# Patient Record
Sex: Female | Born: 1995 | Hispanic: Yes | Marital: Single | Smoking: Never smoker
Health system: Southern US, Community
[De-identification: ages and names within clinical notes are randomized; demographics above are authoritative.]

## PROBLEM LIST (undated history)

## (undated) ENCOUNTER — Inpatient Hospital Stay: Payer: Self-pay

## (undated) DIAGNOSIS — G43909 Migraine, unspecified, not intractable, without status migrainosus: Secondary | ICD-10-CM

## (undated) DIAGNOSIS — O361920 Maternal care for other isoimmunization, second trimester, not applicable or unspecified: Secondary | ICD-10-CM

## (undated) DIAGNOSIS — D649 Anemia, unspecified: Secondary | ICD-10-CM

## (undated) DIAGNOSIS — Z789 Other specified health status: Secondary | ICD-10-CM

## (undated) DIAGNOSIS — O09299 Supervision of pregnancy with other poor reproductive or obstetric history, unspecified trimester: Secondary | ICD-10-CM

## (undated) HISTORY — DX: Migraine, unspecified, not intractable, without status migrainosus: G43.909

## (undated) HISTORY — PX: APPENDECTOMY: SHX54

---
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
## 2013-07-28 DIAGNOSIS — O1490 Unspecified pre-eclampsia, unspecified trimester: Secondary | ICD-10-CM

## 2016-11-22 NOTE — L&D Delivery Note (Addendum)
Date of delivery: 05/21/17 Estimated Date of Delivery: 05/11/17 LMP:  08/04/16 EGA: 1044w4d  Delivery Note At 11:47 PM a viable female "Dylan" was delivered via Vaginal, Spontaneous Delivery (Presentation: ROA).  APGAR: 8, 9; weight  pending.   Placenta status: spontaneous, intact.  Cord: 3vv, with the following complications: meconium.  Cord pH: not collected  Anesthesia:  Epidural Episiotomy: None Lacerations: 1st degree Suture Repair: 3.0 vicryl Est. Blood Loss (mL): 350cc   Mom presented to L&D with labor. She was given antibiotics for GBS prophylaxis and then AROM'd for meconium.  epidual placed. Progressed to complete, labored down for 2 hours. active second stage: ~1hour.  delivery of fetal head with restitution to ROT.   Anterior then posterior shoulders delivered without difficulty.  Baby placed on mom's chest, and attended to by peds.  We sang happy birthday to baby Dylan.  Cord was then clamped and cut after 60second delay.  Placenta spontaneously delivered, intact.   IV pitocin given for hemorrhage prophylaxis. 1st degree laceration repaired in standard fashion.  Mom to postpartum.  Baby to Couplet care / Skin to Skin.  ----- Ranae Plumberhelsea Nyeem Stoke, MD Attending Obstetrician and Gynecologist [facility], Department of OB/GYN [facility]

## 2016-12-17 ENCOUNTER — Emergency Department
Admission: EM | Admit: 2016-12-17 | Discharge: 2016-12-18 | Disposition: A | Discharge status: Home / Self Care | Payer: Self-pay | Attending: Emergency Medicine | Admitting: Emergency Medicine

## 2016-12-17 ENCOUNTER — Encounter: Payer: Self-pay | Admitting: Emergency Medicine

## 2016-12-17 DIAGNOSIS — R519 Headache, unspecified: Secondary | ICD-10-CM

## 2016-12-17 DIAGNOSIS — Z3A18 18 weeks gestation of pregnancy: Secondary | ICD-10-CM | POA: Insufficient documentation

## 2016-12-17 DIAGNOSIS — R1110 Vomiting, unspecified: Secondary | ICD-10-CM

## 2016-12-17 DIAGNOSIS — O26892 Other specified pregnancy related conditions, second trimester: Secondary | ICD-10-CM | POA: Insufficient documentation

## 2016-12-17 DIAGNOSIS — R51 Headache: Secondary | ICD-10-CM

## 2016-12-17 DIAGNOSIS — Z3492 Encounter for supervision of normal pregnancy, unspecified, second trimester: Secondary | ICD-10-CM

## 2016-12-17 DIAGNOSIS — O219 Vomiting of pregnancy, unspecified: Secondary | ICD-10-CM | POA: Insufficient documentation

## 2016-12-17 LAB — BASIC METABOLIC PANEL
Anion gap: 8 (ref 5–15)
BUN: 8 mg/dL (ref 6–20)
CALCIUM: 8.8 mg/dL — AB (ref 8.9–10.3)
CO2: 25 mmol/L (ref 22–32)
CREATININE: 0.41 mg/dL — AB (ref 0.44–1.00)
Chloride: 103 mmol/L (ref 101–111)
GFR calc Af Amer: >60 mL/min (ref >60)
Glucose, Bld: 100 mg/dL — ABNORMAL HIGH (ref 65–99)
Potassium: 3.6 mmol/L (ref 3.5–5.1)
SODIUM: 136 mmol/L (ref 135–145)

## 2016-12-17 LAB — CBC WITH DIFFERENTIAL/PLATELET
Basophils Absolute: 0 10*3/uL (ref 0–0.1)
Basophils Relative: 0 %
EOS ABS: 0.2 10*3/uL (ref 0–0.7)
EOS PCT: 1 %
HCT: 32.3 % — ABNORMAL LOW (ref 35.0–47.0)
Hemoglobin: 11.1 g/dL — ABNORMAL LOW (ref 12.0–16.0)
LYMPHS ABS: 2.2 10*3/uL (ref 1.0–3.6)
Lymphocytes Relative: 17 %
MCH: 28.1 pg (ref 26.0–34.0)
MCHC: 34.4 g/dL (ref 32.0–36.0)
MCV: 81.6 fL (ref 80.0–100.0)
MONOS PCT: 7 %
Monocytes Absolute: 0.9 10*3/uL (ref 0.2–0.9)
Neutro Abs: 9.8 10*3/uL — ABNORMAL HIGH (ref 1.4–6.5)
Neutrophils Relative %: 75 %
PLATELETS: 364 10*3/uL (ref 150–440)
RBC: 3.96 MIL/uL (ref 3.80–5.20)
RDW: 17.3 % — ABNORMAL HIGH (ref 11.5–14.5)
WBC: 13.2 10*3/uL — AB (ref 3.6–11.0)

## 2016-12-17 LAB — URINALYSIS, ROUTINE W REFLEX MICROSCOPIC
Bilirubin Urine: NEGATIVE
GLUCOSE, UA: NEGATIVE mg/dL
HGB URINE DIPSTICK: NEGATIVE
KETONES UR: NEGATIVE mg/dL
Nitrite: NEGATIVE
Protein, ur: NEGATIVE mg/dL
Specific Gravity, Urine: 1.014 (ref 1.005–1.030)
pH: 7 (ref 5.0–8.0)

## 2016-12-17 MED ORDER — ONDANSETRON 4 MG PO TBDP
4.0000 mg | ORAL_TABLET | Freq: Once | ORAL | Status: AC
  Administered 2016-12-17: 4 mg via ORAL
  Filled 2016-12-17: qty 1

## 2016-12-17 NOTE — ED Triage Notes (Signed)
Pt states that when she woke up yesterday she had a headache. Pt states that now it is worse. Pt states that she has vomited x5 since yesterday. Pt is ambulatory to triage with NAD noted at this time.

## 2016-12-17 NOTE — ED Triage Notes (Signed)
Pt is pregnant at this time but has had no prenatal care. Pt noticed in December that she was missing periods.

## 2016-12-17 NOTE — ED Triage Notes (Signed)
Pt is 2-3 months pregnant, flew in from [place] x 10 days ago. Pt c/o headache and vomiting x 2 days. Pt has taken no meds for her pain. Pt ambulatory to stat desk, in no acute distress at this time. Pt has had no prenatal care for this pregnancy. Pt is G3P2A0.

## 2016-12-18 ENCOUNTER — Emergency Department: Payer: Self-pay

## 2016-12-18 LAB — PREGNANCY, URINE: PREG TEST UR: POSITIVE — AB

## 2016-12-18 LAB — HCG, QUANTITATIVE, PREGNANCY: hCG, Beta Chain, Quant, S: 35714 m[IU]/mL — ABNORMAL HIGH (ref <5)

## 2016-12-18 MED ORDER — DOXYLAMINE-PYRIDOXINE 10-10 MG PO TBEC: DELAYED_RELEASE_TABLET | ORAL | 0 refills | Status: DC

## 2016-12-18 MED ORDER — PROCHLORPERAZINE EDISYLATE 5 MG/ML IJ SOLN
10.0000 mg | Freq: Once | INTRAMUSCULAR | Status: AC
  Administered 2016-12-18: 10 mg via INTRAVENOUS
  Filled 2016-12-18: qty 2

## 2016-12-18 MED ORDER — SODIUM CHLORIDE 0.9 % IV BOLUS (SEPSIS)
1000.0000 mL | Freq: Once | INTRAVENOUS | Status: AC
  Administered 2016-12-18: 1000 mL via INTRAVENOUS

## 2016-12-18 MED ORDER — ACETAMINOPHEN 500 MG PO TABS
1000.0000 mg | ORAL_TABLET | Freq: Once | ORAL | Status: AC
  Administered 2016-12-18: 1000 mg via ORAL
  Filled 2016-12-18: qty 2

## 2016-12-18 NOTE — ED Provider Notes (Addendum)
[facility] Emergency Department Provider Note  ____________________________________________   I have reviewed the triage vital signs and the nursing notes.   HISTORY  Chief Complaint Headache    HPI Sabrina Pace is a 21 y.o. female who is G3 A2, with 2 living children, states her last stroke. Was last June. However at that time she states she had negative blood test and was told she is not pregnant. However, she has had progressive abdominal distention since that time and has been feeling sick off and on. Patient states that she is here from [place]. She has never been tonight states before. She arrived on Wednesday by plane. She states that this is her new home. She is interested in obtaining information about the status of her pregnancy, she also states that she has had a vomiting illness the last few days. She is still feeling the baby move, she's had no vaginal bleeding. She has had a slight headache for the last few days as well. Not the worst headache of life, gradual onset started yesterday. Accompanied with vomiting. Denies any focal neurologic deficit. Denies any stiff neck. Denies any change in vision or hearing, no history of IV drug use, does have a history of recurrent headaches similar to this in the past, not worst headache of life. Has had no diarrhea yet. Positive sick contacts with similar. She is not sure which one started first. Patient was offered an interpreter, she would prefer to have me here and feels very comfortable with my Spanish. She is made aware that at any point she can call for interpreter if  she feels uncomfortable. Patient did receive Zofran in the waiting room, and she is no longer nauseated. She feels her headache is improving. He is taken nothing for the headache today, yesterday she took some Advil. Patient has had appendectomy in the past no other medical conditions. She denies allergies. She denies any focal  neurologic deficit, she is having no abdominal pain she had no vaginal discharge she denies dysuria.   History reviewed. No pertinent past medical history.  There are no active problems to display for this patient.   Past Surgical History:  Procedure Laterality Date  . APPENDECTOMY      Prior to Admission medications   Not on File    Allergies Patient has no known allergies.  No family history on file.  Social History Social History  Substance Use Topics  . Smoking status: Never Smoker  . Smokeless tobacco: Never Used  . Alcohol use No    Review of Systems Constitutional: No fever/chills Eyes: No visual changes. ENT: No sore throat. No stiff neck no neck pain Cardiovascular: Denies chest pain. Respiratory: Denies shortness of breath. Gastrointestinal:   Positive vomiting.  No diarrhea.  No constipation. Genitourinary: Negative for dysuria. Musculoskeletal: Negative lower extremity swelling Skin: Negative for rash. Neurological: Negative for severe headaches, focal weakness or numbness. 10-point ROS otherwise negative.  ____________________________________________   PHYSICAL EXAM:  VITAL SIGNS: ED Triage Vitals  Enc Vitals Group     BP 12/17/16 2305 111/62     Pulse Rate 12/17/16 2305 77     Resp 12/17/16 2305 16     Temp 12/17/16 2305 97.5 F (36.4 C)     Temp Source 12/17/16 2305 Oral     SpO2 12/17/16 2305 99 %     Weight 12/17/16 2306 120 lb (54.4 kg)     Height 12/17/16 2306 4\' 11"  (1.499 m)  Head Circumference --      Peak Flow --      Pain Score 12/17/16 2309 9     Pain Loc --      Pain Edu? --      Excl. in GC? --     Constitutional: Alert and oriented. Well appearing and in no acute distress.Very well-appearing in no acute distress, patient not showing any evidence of illness externally. Eyes: Conjunctivae are normal. PERRL. EOMI. Head: Atraumatic. Nose: No congestion/rhinnorhea. Mouth/Throat: Mucous membranes are moist.  Oropharynx  non-erythematous. Neck: No stridor.   Nontender with no meningismus patient can fully range her neck in all directions including touching her chin to her chest with no discomfort Cardiovascular: Normal rate, regular rhythm. Grossly normal heart sounds.  Good peripheral circulation. Respiratory: Normal respiratory effort.  No retractions. Lungs CTAB. Abdominal: Soft and nontender. To my exam, gravid uterus felt around the umbilicus, nontender. No guarding no rebound Back:  There is no focal tenderness or step off.  there is no midline tenderness there are no lesions noted. there is no CVA tenderness  Musculoskeletal: No lower extremity tenderness, no upper extremity tenderness. No joint effusions, no DVT signs strong distal pulses no edema Neurologic:  Normal speech and language. No gross focal neurologic deficits are appreciated.  Skin:  Skin is warm, dry and intact. No rash noted. Psychiatric: Mood and affect are normal. Speech and behavior are normal.  ____________________________________________   LABS (all labs ordered are listed, but only abnormal results are displayed)  Labs Reviewed  URINALYSIS, ROUTINE W REFLEX MICROSCOPIC - Abnormal; Notable for the following:       Result Value   Color, Urine YELLOW (*)    APPearance CLEAR (*)    Leukocytes, UA SMALL (*)    Bacteria, UA FEW (*)    Squamous Epithelial / LPF 6-30 (*)    All other components within normal limits  CBC WITH DIFFERENTIAL/PLATELET - Abnormal; Notable for the following:    WBC 13.2 (*)    Hemoglobin 11.1 (*)    HCT 32.3 (*)    RDW 17.3 (*)    Neutro Abs 9.8 (*)    All other components within normal limits  BASIC METABOLIC PANEL - Abnormal; Notable for the following:    Glucose, Bld 100 (*)    Creatinine, Ser 0.41 (*)    Calcium 8.8 (*)    All other components within normal limits  HCG, QUANTITATIVE, PREGNANCY - Abnormal; Notable for the following:    hCG, Beta Chain, Quant, S 35,714 (*)    All other  components within normal limits  PREGNANCY, URINE   ____________________________________________  EKG  I personally interpreted any EKGs ordered by me or triage  ____________________________________________  RADIOLOGY  I reviewed any imaging ordered by me or triage that were performed during my shift and, if possible, patient and/or family made aware of any abnormal findings. ____________________________________________   PROCEDURES  Procedure(s) performed: None  Procedures  Critical Care performed: None  ____________________________________________   INITIAL IMPRESSION / ASSESSMENT AND PLAN / ED COURSE  Pertinent labs & imaging results that were available during my care of the patient were reviewed by me and considered in my medical decision making (see chart for details).  Patient here with 2 different issues the first is she has no idea how far along her pregnancy isn't to my exam she is likely well over 20 weeks. We will obtain an ultrasound. Very low suspicion for preeclampsia given normal pressures. White count  somewhat elevated but does not unusual in pregnancy. No evidence of acute pathology with pregnancy today but she will require further evaluation. Specifically she is not had any vaginal bleeding or gush of fluid labor pains abdominal pain etc. Second issue is her vomiting and headache. Patient has a slight headache. She certainly does not appear to be toxic. Low suspicion for meningitis however low suspicion for cavernous sinus thrombosis or other acute intracranial pathology. Obviously, given her pregnancy, we prefer to avoid CT scan if it can be helped. We will treat her with Tylenol and IV fluids as she does appear to be mildly dehydrated after her airplane trip and we will reassess.  ----------------------------------------- 3:01 AM on 12/18/2016 -----------------------------------------  Patient took a nice nap care, workup has no headache, we discussed LP and  CT of the head she and her family declined. There are some limitations of my workup and even though I have very low suspicion for significant cranial pathology, I recently there is no way to definitively rule anything out without further workup which the family would prefer not to have. I do not think this is unreasonable however I did want to make sure that they understand the limitations of the workup for her headache as it stands, and the available interventions and the risks benefits and alternatives to each of those. Patient and family voiced very clear understanding of all of this, and they would prefer to go home at this time. They certainly do not wish an LP or ACT. Patient and family will follow-up very closely at the health department for her pregnancy, I will advise that she take prenatal vitamins, there is no evidence of acute pathology with the pregnancy today and she is approximately [redacted] weeks pregnant with no vaginal bleeding or abdominal pain. Patient is in her third pregnancy, she understands what to look for. We will give her antinausea medication, and extensive return precautions and her native language.    ____________________________________________   FINAL CLINICAL IMPRESSION(S) / ED DIAGNOSES  Final diagnoses:  None      This chart was dictated using voice recognition software.  Despite best efforts to proofread,  errors can occur which can change meaning.      Jeanmarie Plant, MD 12/18/16 1610    Jeanmarie Plant, MD 12/18/16 9604    Jeanmarie Plant, MD 12/18/16 [phone]

## 2016-12-18 NOTE — ED Notes (Signed)
Lab called

## 2017-01-18 ENCOUNTER — Other Ambulatory Visit: Payer: Self-pay | Admitting: Advanced Practice Midwife

## 2017-01-18 DIAGNOSIS — Z3482 Encounter for supervision of other normal pregnancy, second trimester: Secondary | ICD-10-CM

## 2017-01-18 LAB — HM PAP SMEAR: HM Pap smear: NEGATIVE

## 2017-01-25 ENCOUNTER — Other Ambulatory Visit: Payer: Self-pay | Admitting: Advanced Practice Midwife

## 2017-01-25 DIAGNOSIS — Z3689 Encounter for other specified antenatal screening: Secondary | ICD-10-CM

## 2017-01-27 ENCOUNTER — Ambulatory Visit: Payer: Self-pay

## 2017-02-07 ENCOUNTER — Ambulatory Visit
Admission: RE | Admit: 2017-02-07 | Discharge: 2017-02-07 | Disposition: A | Discharge status: Home / Self Care | Payer: Medicaid Other | Source: Ambulatory Visit | Attending: Obstetrics and Gynecology | Admitting: Obstetrics and Gynecology

## 2017-02-07 ENCOUNTER — Ambulatory Visit
Admission: RE | Admit: 2017-02-07 | Discharge: 2017-02-07 | Disposition: A | Discharge status: Home / Self Care | Payer: Self-pay | Source: Ambulatory Visit | Attending: Obstetrics and Gynecology | Admitting: Obstetrics and Gynecology

## 2017-02-07 ENCOUNTER — Other Ambulatory Visit: Payer: Self-pay | Admitting: Obstetrics and Gynecology

## 2017-02-07 DIAGNOSIS — O361920 Maternal care for other isoimmunization, second trimester, not applicable or unspecified: Secondary | ICD-10-CM

## 2017-02-07 DIAGNOSIS — O0932 Supervision of pregnancy with insufficient antenatal care, second trimester: Secondary | ICD-10-CM | POA: Insufficient documentation

## 2017-02-07 DIAGNOSIS — Z3689 Encounter for other specified antenatal screening: Secondary | ICD-10-CM

## 2017-02-07 DIAGNOSIS — IMO0002 Reserved for concepts with insufficient information to code with codable children: Secondary | ICD-10-CM | POA: Insufficient documentation

## 2017-02-07 DIAGNOSIS — Z3A26 26 weeks gestation of pregnancy: Secondary | ICD-10-CM | POA: Insufficient documentation

## 2017-02-07 DIAGNOSIS — Z3482 Encounter for supervision of other normal pregnancy, second trimester: Secondary | ICD-10-CM

## 2017-02-07 DIAGNOSIS — Z915 Personal history of self-harm: Secondary | ICD-10-CM

## 2017-02-07 DIAGNOSIS — Z8759 Personal history of other complications of pregnancy, childbirth and the puerperium: Secondary | ICD-10-CM | POA: Diagnosis not present

## 2017-02-07 DIAGNOSIS — Z9289 Personal history of other medical treatment: Secondary | ICD-10-CM | POA: Insufficient documentation

## 2017-02-07 DIAGNOSIS — O09292 Supervision of pregnancy with other poor reproductive or obstetric history, second trimester: Secondary | ICD-10-CM

## 2017-02-07 HISTORY — DX: Anemia, unspecified: D64.9

## 2017-02-07 HISTORY — DX: Personal history of other medical treatment: Z92.89

## 2017-02-07 HISTORY — DX: Maternal care for other isoimmunization, second trimester, not applicable or unspecified: O36.1920

## 2017-02-07 HISTORY — DX: Other specified health status: Z78.9

## 2017-02-07 NOTE — Progress Notes (Signed)
[facility]aternal-[facility]etal [facility] Consultation   Chief Complaint: Anti-Kell antibody  HPI: Ms. Sabrina Pace is a 21 y.o. 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  Past Medical History: Patient  has a past medical history of Anemia requiring blood transfusion after her 2nd delivery (she denies complications of postpartum hemorrhage).  She does have a history of self-injurious behavior and admits to cutting.  Had declined a therapist previously but will consider it. Past Surgical History: She  has a past surgical history that includes Appendectomy.  Obstetric History:  OB History    Gravida Para Term Preterm AB Living   3 2 2     2    SAB TAB Ectopic Multiple Live Births                Each pregnancy is with a different FOB. The current pregnancy's FOB is currently not in the [place].  Gynecologic History:  No LMP recorded. Patient is pregnant.  Last pap smear patient has never had a pap test  Was diagnosed with chlamydia at the HD and was treated.  Medications: PNVs, iron Allergies: Patient has No Known Allergies.  Social History: Patient  reports that she has never smoked. She has never used smokeless tobacco. She reports that she does not drink alcohol or use drugs. Reported last ETOH use was May 2017.  Her 2 other children are in [place] with her MGM.  The father of this baby is currently trying to make his way to the [place].  Patient feels very conflicted about being here. Family History: family history includes Diabetes in her maternal grandmother and mother; Hypertension in her maternal grandmother. Her mother had premature twins who had epilepsy as children Review of Systems A full 12 point review of systems was negative or as noted in the History of Present Illness.  US today demonstrates normal anatomy; EGA of 6162w5d.  948 gramps, 35th%, posterior placenta.   MCA Dopplers were in zone C (NOT consistent with anemia).  Physical Exam: BP (!) 96/54   Pulse 78   Temp 97.5 F (36.4 C)   Resp 16   Ht 5\' 3"  (1.6 m)   Wt 153 lb (69.4 kg)   SpO2 98%   BMI 27.10 kg/m    Asessement: Patient Active Problem List   Diagnosis Date Noted  . 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 History of blood transfusion 02/07/2017  . History of self-harm 02/07/2017    Plan: 1.  Alloimmunization.  Anti-Kell antibody can cause hemolytic disease of the fetus and/or newborn.  There is no "safe" titer unlike other antibodies.  However, it is very likely that she became sensitized after her blood transfusion in [place].  Currently the FOB is not living in the [place] so is unavailable for antigen testing.  The current FOB is different from other other children.  Offered amniocentesis to determine fetal antigen status versus expectant management with MCA Dopplers/hydrops evaluation every 2 weeks to screen for fetal anemia.  Patient elected to schedule an amniocentesis for fetal antigen testing -  [facility] 7:30 am.  Instructions were provided. 2.  Patient denies a history of preeclampsia or any problems with hypertension (this was noted in her prenatal records from the HD).  She does admit to having HAs and occasional dizziness but not elevated BPs. - her 2nd child, however was only 5 #.  Consider [place] in the third trimester to assess growth. 3.  History of blood transfusion - recommend type and crossing for 2U at the time of delivery given presence of an antibody and history of anemia - patient states she had no bleeding complications, this was prophylactically given to her because of her anemia. 4.  History of self-harm - admits to cutting behavior.  Offered referral to therapist and acknowledged that her social history is very stressful.  She hasn't seen her mother in 20 years and she brought her here with the  opportunity to acquire legal status and hopefully to be able to bring her other children here.  But living with her mother and other younger siblings has been difficult and she misses her home.  Will consider referral (please reoffer at the HD).  Mother seems very supportive.  Will follow up after results of amniocentesis.  If fetus is Kell antigen negative, will not need to follow this pregnancy and she may return to routine prenatal care.   Total time spent with the patient was 30 minutes with greater than 50% spent in counseling and coordination of care.  We appreciate this interesting consult and will be happy to be involved in the ongoing care of Sabrina Pace in anyway her obstetricians desire.  Kirby Funk, MD Maternal-Fetal Medicine [facility]

## 2017-02-24 ENCOUNTER — Other Ambulatory Visit: Payer: Self-pay

## 2017-03-03 ENCOUNTER — Ambulatory Visit
Admission: RE | Admit: 2017-03-03 | Discharge: 2017-03-03 | Disposition: A | Discharge status: Home / Self Care | Payer: Self-pay | Source: Ambulatory Visit | Attending: Obstetrics & Gynecology | Admitting: Obstetrics & Gynecology

## 2017-03-03 DIAGNOSIS — Z915 Personal history of self-harm: Secondary | ICD-10-CM

## 2017-03-03 DIAGNOSIS — IMO0002 Reserved for concepts with insufficient information to code with codable children: Secondary | ICD-10-CM

## 2017-03-03 DIAGNOSIS — O361920 Maternal care for other isoimmunization, second trimester, not applicable or unspecified: Secondary | ICD-10-CM | POA: Diagnosis present

## 2017-03-03 DIAGNOSIS — Z9289 Personal history of other medical treatment: Secondary | ICD-10-CM

## 2017-03-03 NOTE — Progress Notes (Signed)
[facility] : Ms Sabrina Pace was seen with a Spanish interpreter. She is a 21 year-old G3 P2002 at 68 1/7 weeks who presents to review her amniocentesis results. She underwent an amniocentesis on 02/15/17 to test fetal Kell antigen status in setting of Kell isoimmunization. She has no complaints.  Fetal Kell testing returned from the [facility]. The fetus was found to have a normal karyotype (46, XY) and was not found to carry the Kell antigen (KK negative, kk positive), therefore her fetus is not at risk for anemia secondary to isoimmunization.  She can continue routine prenatal care and can deliver at [facility]. Return for growth scan in 2-3 weeks given history of prior delivery complicated by [facility]. This appt was provided.   Charmeka [place], [place] A, MD

## 2017-03-10 ENCOUNTER — Other Ambulatory Visit: Payer: Self-pay

## 2017-03-24 ENCOUNTER — Other Ambulatory Visit: Payer: Self-pay | Admitting: *Deleted

## 2017-03-24 DIAGNOSIS — Z8759 Personal history of other complications of pregnancy, childbirth and the puerperium: Secondary | ICD-10-CM

## 2017-03-25 DIAGNOSIS — R7611 Nonspecific reaction to tuberculin skin test without active tuberculosis: Secondary | ICD-10-CM | POA: Insufficient documentation

## 2017-03-28 ENCOUNTER — Ambulatory Visit
Admission: RE | Admit: 2017-03-28 | Discharge: 2017-03-28 | Disposition: A | Discharge status: Home / Self Care | Payer: Self-pay | Source: Ambulatory Visit | Attending: Family Medicine | Admitting: Family Medicine

## 2017-03-28 ENCOUNTER — Ambulatory Visit
Admission: RE | Admit: 2017-03-28 | Discharge: 2017-03-28 | Disposition: A | Discharge status: Home / Self Care | Payer: Self-pay | Source: Ambulatory Visit | Attending: Maternal & Fetal Medicine | Admitting: Maternal & Fetal Medicine

## 2017-03-28 ENCOUNTER — Other Ambulatory Visit (HOSPITAL_COMMUNITY): Payer: Self-pay | Admitting: Family Medicine

## 2017-03-28 DIAGNOSIS — O361920 Maternal care for other isoimmunization, second trimester, not applicable or unspecified: Secondary | ICD-10-CM

## 2017-03-28 DIAGNOSIS — Z915 Personal history of self-harm: Secondary | ICD-10-CM

## 2017-03-28 DIAGNOSIS — R7611 Nonspecific reaction to tuberculin skin test without active tuberculosis: Secondary | ICD-10-CM

## 2017-03-28 DIAGNOSIS — Z3A33 33 weeks gestation of pregnancy: Secondary | ICD-10-CM | POA: Insufficient documentation

## 2017-03-28 DIAGNOSIS — Z9289 Personal history of other medical treatment: Secondary | ICD-10-CM

## 2017-03-28 DIAGNOSIS — IMO0002 Reserved for concepts with insufficient information to code with codable children: Secondary | ICD-10-CM

## 2017-03-28 DIAGNOSIS — O26893 Other specified pregnancy related conditions, third trimester: Secondary | ICD-10-CM | POA: Insufficient documentation

## 2017-03-28 DIAGNOSIS — Z8759 Personal history of other complications of pregnancy, childbirth and the puerperium: Secondary | ICD-10-CM | POA: Insufficient documentation

## 2017-05-21 ENCOUNTER — Inpatient Hospital Stay
Admission: EM | Admit: 2017-05-21 | Discharge: 2017-05-23 | DRG: 775 | Disposition: A | Discharge status: Home / Self Care | Payer: Medicaid Other | Attending: Obstetrics & Gynecology | Admitting: Obstetrics & Gynecology

## 2017-05-21 ENCOUNTER — Inpatient Hospital Stay: Payer: Medicaid Other | Admitting: Anesthesiology

## 2017-05-21 ENCOUNTER — Encounter: Payer: Self-pay | Admitting: *Deleted

## 2017-05-21 DIAGNOSIS — O98819 Other maternal infectious and parasitic diseases complicating pregnancy, unspecified trimester: Secondary | ICD-10-CM

## 2017-05-21 DIAGNOSIS — O99340 Other mental disorders complicating pregnancy, unspecified trimester: Secondary | ICD-10-CM

## 2017-05-21 DIAGNOSIS — D509 Iron deficiency anemia, unspecified: Secondary | ICD-10-CM | POA: Diagnosis present

## 2017-05-21 DIAGNOSIS — Z3A41 41 weeks gestation of pregnancy: Secondary | ICD-10-CM | POA: Diagnosis not present

## 2017-05-21 DIAGNOSIS — A749 Chlamydial infection, unspecified: Secondary | ICD-10-CM | POA: Diagnosis present

## 2017-05-21 DIAGNOSIS — O99824 Streptococcus B carrier state complicating childbirth: Secondary | ICD-10-CM | POA: Diagnosis present

## 2017-05-21 DIAGNOSIS — O9902 Anemia complicating childbirth: Principal | ICD-10-CM | POA: Diagnosis present

## 2017-05-21 DIAGNOSIS — O99344 Other mental disorders complicating childbirth: Secondary | ICD-10-CM | POA: Diagnosis present

## 2017-05-21 DIAGNOSIS — F329 Major depressive disorder, single episode, unspecified: Secondary | ICD-10-CM | POA: Diagnosis present

## 2017-05-21 DIAGNOSIS — D649 Anemia, unspecified: Secondary | ICD-10-CM | POA: Diagnosis present

## 2017-05-21 DIAGNOSIS — O99820 Streptococcus B carrier state complicating pregnancy: Secondary | ICD-10-CM

## 2017-05-21 DIAGNOSIS — Z9289 Personal history of other medical treatment: Secondary | ICD-10-CM

## 2017-05-21 DIAGNOSIS — Z3493 Encounter for supervision of normal pregnancy, unspecified, third trimester: Secondary | ICD-10-CM | POA: Diagnosis present

## 2017-05-21 DIAGNOSIS — O09299 Supervision of pregnancy with other poor reproductive or obstetric history, unspecified trimester: Secondary | ICD-10-CM

## 2017-05-21 DIAGNOSIS — IMO0002 Reserved for concepts with insufficient information to code with codable children: Secondary | ICD-10-CM

## 2017-05-21 DIAGNOSIS — Z862 Personal history of diseases of the blood and blood-forming organs and certain disorders involving the immune mechanism: Secondary | ICD-10-CM

## 2017-05-21 DIAGNOSIS — Z915 Personal history of self-harm: Secondary | ICD-10-CM

## 2017-05-21 DIAGNOSIS — O361920 Maternal care for other isoimmunization, second trimester, not applicable or unspecified: Secondary | ICD-10-CM

## 2017-05-21 DIAGNOSIS — Z8759 Personal history of other complications of pregnancy, childbirth and the puerperium: Secondary | ICD-10-CM

## 2017-05-21 DIAGNOSIS — F32A Depression, unspecified: Secondary | ICD-10-CM | POA: Diagnosis present

## 2017-05-21 LAB — CBC
HCT: 32.3 % — ABNORMAL LOW (ref 35.0–47.0)
Hemoglobin: 11.1 g/dL — ABNORMAL LOW (ref 12.0–16.0)
MCH: 29.7 pg (ref 26.0–34.0)
MCHC: 34.3 g/dL (ref 32.0–36.0)
MCV: 86.7 fL (ref 80.0–100.0)
PLATELETS: 229 10*3/uL (ref 150–440)
RBC: 3.73 MIL/uL — AB (ref 3.80–5.20)
RDW: 14.6 % — ABNORMAL HIGH (ref 11.5–14.5)
WBC: 11.2 10*3/uL — AB (ref 3.6–11.0)

## 2017-05-21 LAB — ABO/RH: ABO/RH(D): A POS

## 2017-05-21 MED ORDER — AMMONIA AROMATIC IN INHA
RESPIRATORY_TRACT | Status: AC
  Filled 2017-05-21: qty 10

## 2017-05-21 MED ORDER — EPHEDRINE 5 MG/ML INJ
10.0000 mg | INTRAVENOUS | Status: DC | PRN
  Filled 2017-05-21: qty 2

## 2017-05-21 MED ORDER — LACTATED RINGERS IV SOLN
INTRAVENOUS | Status: DC
Start: 2017-05-21 — End: 2017-05-23
  Administered 2017-05-21 (×2): via INTRAVENOUS

## 2017-05-21 MED ORDER — OXYTOCIN 40 UNITS IN LACTATED RINGERS INFUSION - SIMPLE MED
2.5000 [IU]/h | INTRAVENOUS | Status: DC
  Administered 2017-05-21: 2 m[IU]/min via INTRAVENOUS
  Filled 2017-05-21: qty 1000

## 2017-05-21 MED ORDER — LIDOCAINE HCL (PF) 1 % IJ SOLN
INTRAMUSCULAR | Status: AC
  Filled 2017-05-21: qty 30

## 2017-05-21 MED ORDER — LIDOCAINE HCL (PF) 1 % IJ SOLN
INTRAMUSCULAR | Status: DC | PRN
  Administered 2017-05-21: 3 mL via SUBCUTANEOUS

## 2017-05-21 MED ORDER — OXYTOCIN 10 UNIT/ML IJ SOLN
INTRAMUSCULAR | Status: AC
  Filled 2017-05-21: qty 2

## 2017-05-21 MED ORDER — FENTANYL 2.5 MCG/ML W/ROPIVACAINE 0.15% IN NS 100 ML EPIDURAL (ARMC)
EPIDURAL | Status: AC
  Filled 2017-05-21: qty 100

## 2017-05-21 MED ORDER — ONDANSETRON HCL 4 MG/2ML IJ SOLN: 4.0000 mg | Freq: Four times a day (QID) | INTRAMUSCULAR | Status: DC | PRN

## 2017-05-21 MED ORDER — LACTATED RINGERS IV SOLN
500.0000 mL | INTRAVENOUS | Status: DC | PRN
  Administered 2017-05-21: 500 mL via INTRAVENOUS

## 2017-05-21 MED ORDER — FENTANYL 2.5 MCG/ML W/ROPIVACAINE 0.15% IN NS 100 ML EPIDURAL (ARMC): 12.0000 mL/h | EPIDURAL | Status: DC

## 2017-05-21 MED ORDER — LIDOCAINE HCL (PF) 1 % IJ SOLN
30.0000 mL | INTRAMUSCULAR | Status: DC | PRN
Start: 2017-05-21 — End: 2017-05-22

## 2017-05-21 MED ORDER — SODIUM CHLORIDE 0.9 % IV SOLN
INTRAVENOUS | Status: DC | PRN
  Administered 2017-05-21 (×2): 5 mL via EPIDURAL

## 2017-05-21 MED ORDER — SODIUM CHLORIDE 0.9 % IV SOLN
2.0000 g | Freq: Once | INTRAVENOUS | Status: AC
  Administered 2017-05-21: 2 g via INTRAVENOUS
  Filled 2017-05-21: qty 2000

## 2017-05-21 MED ORDER — SOD CITRATE-CITRIC ACID 500-334 MG/5ML PO SOLN
30.0000 mL | ORAL | Status: DC | PRN
Start: 2017-05-21 — End: 2017-05-22

## 2017-05-21 MED ORDER — DIPHENHYDRAMINE HCL 50 MG/ML IJ SOLN: 12.5000 mg | INTRAMUSCULAR | Status: DC | PRN

## 2017-05-21 MED ORDER — SODIUM CHLORIDE 0.9 % IV SOLN
1.0000 g | INTRAVENOUS | Status: DC
  Administered 2017-05-21 (×2): 1 g via INTRAVENOUS
  Filled 2017-05-21 (×5): qty 1000

## 2017-05-21 MED ORDER — FENTANYL 2.5 MCG/ML W/ROPIVACAINE 0.15% IN NS 100 ML EPIDURAL (ARMC)
EPIDURAL | Status: DC | PRN
  Administered 2017-05-21: 12 mL/h via EPIDURAL

## 2017-05-21 MED ORDER — BUTORPHANOL TARTRATE 1 MG/ML IJ SOLN: 1.0000 mg | INTRAMUSCULAR | Status: DC | PRN

## 2017-05-21 MED ORDER — MISOPROSTOL 200 MCG PO TABS
ORAL_TABLET | ORAL | Status: AC
  Filled 2017-05-21: qty 4

## 2017-05-21 MED ORDER — OXYTOCIN BOLUS FROM INFUSION: 500.0000 mL | Freq: Once | INTRAVENOUS | Status: DC

## 2017-05-21 MED ORDER — LIDOCAINE-EPINEPHRINE (PF) 1.5 %-1:200000 IJ SOLN
INTRAMUSCULAR | Status: DC | PRN
  Administered 2017-05-21: 3 mL via EPIDURAL

## 2017-05-21 MED ORDER — SODIUM CHLORIDE FLUSH 0.9 % IV SOLN
INTRAVENOUS | Status: AC
  Filled 2017-05-21: qty 10

## 2017-05-21 MED ORDER — ACETAMINOPHEN 500 MG PO TABS: 1000.0000 mg | ORAL_TABLET | ORAL | Status: DC | PRN

## 2017-05-21 MED ORDER — LACTATED RINGERS IV SOLN
500.0000 mL | Freq: Once | INTRAVENOUS | Status: AC
  Administered 2017-05-21: 500 mL via INTRAVENOUS

## 2017-05-21 MED ORDER — PHENYLEPHRINE 40 MCG/ML (10ML) SYRINGE FOR IV PUSH (FOR BLOOD PRESSURE SUPPORT)
80.0000 ug | PREFILLED_SYRINGE | INTRAVENOUS | Status: DC | PRN
  Filled 2017-05-21: qty 5

## 2017-05-21 NOTE — Progress Notes (Signed)
Due to nursing shortage, there has been a delay in starting to push.

## 2017-05-21 NOTE — Anesthesia Preprocedure Evaluation (Signed)
Anesthesia Evaluation  Patient identified by MRN, date of birth, ID band Patient awake    Reviewed: Allergy & Precautions, H&P , NPO status , Patient's Chart, lab work & pertinent test results, reviewed documented beta blocker date and time   History of Anesthesia Complications Negative for: history of anesthetic complications  Airway Mallampati: I  TM Distance: >3 FB Neck ROM: full    Dental  (+) Teeth Intact   Pulmonary neg pulmonary ROS,           Cardiovascular Exercise Tolerance: Good negative cardio ROS       Neuro/Psych negative neurological ROS  negative psych ROS   GI/Hepatic negative GI ROS, Neg liver ROS,   Endo/Other  negative endocrine ROS  Renal/GU negative Renal ROS  negative genitourinary   Musculoskeletal   Abdominal   Peds  Hematology  (+) Blood dyscrasia, anemia ,   Anesthesia Other Findings Past Medical History: No date: Anemia No date: Medical history non-contributory   Reproductive/Obstetrics (+) Pregnancy                             Anesthesia Physical Anesthesia Plan  ASA: II  Anesthesia Plan: Epidural   Post-op Pain Management:    Induction:   PONV Risk Score and Plan:   Airway Management Planned:   Additional Equipment:   Intra-op Plan:   Post-operative Plan:   Informed Consent: I have reviewed the patients History and Physical, chart, labs and discussed the procedure including the risks, benefits and alternatives for the proposed anesthesia with the patient or authorized representative who has indicated his/her understanding and acceptance.   Dental Advisory Given  Plan Discussed with: Anesthesiologist, CRNA and Surgeon  Anesthesia Plan Comments:         Anesthesia Quick Evaluation

## 2017-05-21 NOTE — H&P (Addendum)
OB History & Physical   History of Present Illness:  Chief Complaint:   HPI:  Sabrina Pace is a 21 y.o. 813P2002 female at 4185w3d dated by 26 week ultrasound with Estimated Date of Delivery: 05/11/17.  LMP not consistent with dates, estimated to be 08/04/17   She presents to L&D for contractions since 2am  +FM, + CTX, no LOF, no VB  Pregnancy Issues: 1. 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 Depression, history of self harm (cutter, active in this pregnancy) 3. GBS positive 4. Chlamydia in this pregnancy 5. Possible zika exposure  - testing negative 6. History of preeclmapsia 7. Anemia 8. History of post partum hemorrhage requiring 2u PRBC (last pregnancy)  Maternal Medical History:   Past Medical History:  Diagnosis Date  . Anemia   . Medical history non-contributory     Past Surgical History:  Procedure Laterality Date  . APPENDECTOMY      No Known Allergies  Prior to Admission medications   Medication Sig Start Date End Date Taking? Authorizing Provider  ferrous sulfate 325 (65 FE) MG tablet Take 325 mg by mouth daily with breakfast.   Yes [provider]  Prenatal Vit-Fe Fumarate-FA (MULTIVITAMIN-PRENATAL) 27-0.8 MG TABS tablet Take 1 tablet by mouth daily at 12 noon.   Yes [provider]  Doxylamine-Pyridoxine 10-10 MG TBEC 2 tabs orally at hs (Day 1). If this works, continue taking 2 tab qhs. However, if nausea persists Day 2, take 2 tabs at bedtime that night then take three tablets starting on Day 3 (one tablet in the morning and two tablets at bedtime). If these 3 tablets control symptoms on Day 4, continue taking 3 tabs daily. Otherwise take 4 tabs starting on Day 4 (one tablet in the morning, one tablet mid-afternoon and two tablets at bedtime). Patient not taking: Reported on 05/21/2017 12/18/16   Jeanmarie PlantMcShane, James A, MD     [facility]   Social History: She  reports that she has never smoked. She has never used smokeless tobacco. She reports that she does not drink alcohol or use drugs.  Family History: family history includes Diabetes in her maternal grandmother and mother; Hypertension in her maternal grandmother.   Review of Systems: A full review of systems was performed and negative except as noted in the HPI.     Physical Exam:  Vital Signs: There were no vitals taken for this visit. General: no acute distress.  HEENT: normocephalic, atraumatic Heart: regular rate & rhythm.  No murmurs/rubs/gallops Lungs: clear to auscultation bilaterally, normal respiratory effort Abdomen: soft, gravid, non-tender;  EFW: 6.10 Pelvic:   External: Normal external female genitalia  Cervix: Dilation: 5 / Effacement (%): 80 / Station: -2 bulging bag   Extremities: non-tender, symmetric, 1+ edema bilaterally.  DTRs: 2+  Neurologic: Alert & oriented x 3.     Pertinent Results:  Prenatal Labs: Blood type/Rh A+, Anti-Kell  Antibody screen neg  Rubella Immune  Varicella Immune  RPR NR  HBsAg Neg  HIV NR  GC neg  Chlamydia Positive - neg test of cure  Genetic screening negative  1 hour GTT 93  3 hour GTT   GBS Positive 04/21/17   FHT: 130 mod + accels no decels TOCO: q319min SVE:  Dilation: 5 / Effacement (%): 80 / Station: -2 bulging bag   Cephalic by leopolds  Assessment:  Sabrina ApplebaumNorma Josselin Aguilar  K[place] Pace is a 21 y.o. G93P2002 female at [redacted]w[redacted]d with labor.   Plan:  1. Admit to Labor & Delivery 2. CBC, T&S, Clrs, IVF 3. GBS positive - ampicillin for ppx  4. Consents obtained. 5. Continuous efm/toco 6. Expectant management; will consider AROM 4hrs after abx administration 7. Anti-Kell - blood bank contacted and will have 2u cross matched for her.   8. History of PPH - will have rescue supplies/meds at bedside.  ----- Ranae Plumber, MD Attending Obstetrician and Gynecologist [facility],  Department of OB/GYN [facility]

## 2017-05-21 NOTE — Progress Notes (Signed)
Patient complete and -1, will labor down x 1 hour

## 2017-05-21 NOTE — Anesthesia Procedure Notes (Signed)
Epidural Patient location during procedure: OB Start time: 05/21/2017 5:06 PM End time: 05/21/2017 5:13 PM  Staffing Anesthesiologist: Lenard SimmerKARENZ, Ulisses Vondrak Performed: anesthesiologist   Preanesthetic Checklist Completed: patient identified, site marked, surgical consent, pre-op evaluation, timeout performed, IV checked, risks and benefits discussed and monitors and equipment checked  Epidural Patient position: sitting Prep: ChloraPrep Patient monitoring: heart rate, continuous pulse ox and blood pressure Approach: midline Location: L3-L4 Injection technique: LOR saline  Needle:  Needle type: Tuohy  Needle gauge: 17 G Needle length: 9 cm and 9 Needle insertion depth: 3.5 cm Catheter type: closed end flexible Catheter size: 19 Gauge Catheter at skin depth: 8 cm Test dose: negative and 1.5% lidocaine with Epi 1:200 K  Assessment Sensory level: T10 Events: blood not aspirated, injection not painful, no injection resistance, negative IV test and no paresthesia  Additional Notes Pt. Evaluated and documentation done after procedure finished. Patient identified. Risks/Benefits/Options discussed with patient including but not limited to bleeding, infection, nerve damage, paralysis, failed block, incomplete pain control, headache, blood pressure changes, nausea, vomiting, reactions to medication both or allergic, itching and postpartum back pain. Confirmed with bedside nurse the patient's most recent platelet count. Confirmed with patient that they are not currently taking any anticoagulation, have any bleeding history or any family history of bleeding disorders. Patient expressed understanding and wished to proceed. All questions were answered. Sterile technique was used throughout the entire procedure. Please see nursing notes for vital signs. Test dose was given through epidural catheter and negative prior to continuing to dose epidural or start infusion. Warning signs of high block given to the  patient including shortness of breath, tingling/numbness in hands, complete motor block, or any concerning symptoms with instructions to call for help. Patient was given instructions on fall risk and not to get out of bed. All questions and concerns addressed with instructions to call with any issues or inadequate analgesia.   Patient tolerated the insertion well without immediate complications.Reason for block:procedure for pain

## 2017-05-22 DIAGNOSIS — O99820 Streptococcus B carrier state complicating pregnancy: Secondary | ICD-10-CM

## 2017-05-22 DIAGNOSIS — O09299 Supervision of pregnancy with other poor reproductive or obstetric history, unspecified trimester: Secondary | ICD-10-CM

## 2017-05-22 DIAGNOSIS — O99340 Other mental disorders complicating pregnancy, unspecified trimester: Secondary | ICD-10-CM

## 2017-05-22 DIAGNOSIS — Z8759 Personal history of other complications of pregnancy, childbirth and the puerperium: Secondary | ICD-10-CM

## 2017-05-22 DIAGNOSIS — Z862 Personal history of diseases of the blood and blood-forming organs and certain disorders involving the immune mechanism: Secondary | ICD-10-CM

## 2017-05-22 DIAGNOSIS — O98819 Other maternal infectious and parasitic diseases complicating pregnancy, unspecified trimester: Secondary | ICD-10-CM

## 2017-05-22 DIAGNOSIS — A749 Chlamydial infection, unspecified: Secondary | ICD-10-CM | POA: Diagnosis present

## 2017-05-22 DIAGNOSIS — D649 Anemia, unspecified: Secondary | ICD-10-CM | POA: Diagnosis present

## 2017-05-22 DIAGNOSIS — F329 Major depressive disorder, single episode, unspecified: Secondary | ICD-10-CM | POA: Diagnosis present

## 2017-05-22 DIAGNOSIS — F32A Depression, unspecified: Secondary | ICD-10-CM | POA: Diagnosis present

## 2017-05-22 HISTORY — DX: Supervision of pregnancy with other poor reproductive or obstetric history, unspecified trimester: O09.299

## 2017-05-22 HISTORY — DX: Personal history of other complications of pregnancy, childbirth and the puerperium: Z87.59

## 2017-05-22 LAB — CBC
HCT: 31.6 % — ABNORMAL LOW (ref 35.0–47.0)
Hemoglobin: 10.9 g/dL — ABNORMAL LOW (ref 12.0–16.0)
MCH: 29.8 pg (ref 26.0–34.0)
MCHC: 34.6 g/dL (ref 32.0–36.0)
MCV: 86.1 fL (ref 80.0–100.0)
PLATELETS: 224 10*3/uL (ref 150–440)
RBC: 3.67 MIL/uL — AB (ref 3.80–5.20)
RDW: 14.3 % (ref 11.5–14.5)
WBC: 15.4 10*3/uL — AB (ref 3.6–11.0)

## 2017-05-22 LAB — RPR: RPR Ser Ql: NONREACTIVE

## 2017-05-22 MED ORDER — ONDANSETRON HCL 4 MG PO TABS: 4.0000 mg | ORAL_TABLET | ORAL | Status: DC | PRN

## 2017-05-22 MED ORDER — SIMETHICONE 80 MG PO CHEW: 80.0000 mg | CHEWABLE_TABLET | ORAL | Status: DC | PRN

## 2017-05-22 MED ORDER — HYDROCORTISONE 2.5 % RE CREA
TOPICAL_CREAM | RECTAL | Status: DC
Start: 2017-05-22 — End: 2017-05-23
  Filled 2017-05-22: qty 28.35

## 2017-05-22 MED ORDER — ONDANSETRON HCL 4 MG/2ML IJ SOLN: 4.0000 mg | INTRAMUSCULAR | Status: DC | PRN

## 2017-05-22 MED ORDER — ACETAMINOPHEN 500 MG PO TABS
1000.0000 mg | ORAL_TABLET | Freq: Four times a day (QID) | ORAL | Status: DC | PRN
  Administered 2017-05-22: 1000 mg via ORAL
  Filled 2017-05-22: qty 2

## 2017-05-22 MED ORDER — DOCUSATE SODIUM 100 MG PO CAPS
100.0000 mg | ORAL_CAPSULE | Freq: Two times a day (BID) | ORAL | Status: DC
  Administered 2017-05-22: 100 mg via ORAL
  Filled 2017-05-22: qty 1

## 2017-05-22 MED ORDER — IBUPROFEN 600 MG PO TABS
600.0000 mg | ORAL_TABLET | Freq: Four times a day (QID) | ORAL | Status: DC
  Administered 2017-05-22 – 2017-05-23 (×6): 600 mg via ORAL
  Filled 2017-05-22 (×6): qty 1

## 2017-05-22 MED ORDER — COCONUT OIL OIL: 1.0000 "application " | TOPICAL_OIL | Status: DC | PRN

## 2017-05-22 MED ORDER — WITCH HAZEL-GLYCERIN EX PADS: 1.0000 "application " | MEDICATED_PAD | CUTANEOUS | Status: DC

## 2017-05-22 MED ORDER — PRENATAL PLUS 27-1 MG PO TABS
1.0000 | ORAL_TABLET | Freq: Every day | ORAL | Status: DC
  Administered 2017-05-22: 1 via ORAL
  Filled 2017-05-22: qty 1

## 2017-05-22 MED ORDER — BENZOCAINE-MENTHOL 20-0.5 % EX AERO: 1.0000 "application " | INHALATION_SPRAY | CUTANEOUS | Status: DC | PRN

## 2017-05-22 MED ORDER — DIPHENHYDRAMINE HCL 25 MG PO CAPS: 25.0000 mg | ORAL_CAPSULE | Freq: Four times a day (QID) | ORAL | Status: DC | PRN

## 2017-05-22 NOTE — Progress Notes (Signed)
Post Partum Day 1 Subjective: no complaints, up ad lib, voiding and tolerating PO  Objective: Blood pressure 106/74, pulse 64, temperature 98.6 F (37 C), temperature source Oral, resp. rate 18, height 5\' 2"  (1.575 m), weight 160 lb (72.6 kg), SpO2 99 %, unknown if currently breastfeeding.  Physical Exam:  General: alert, cooperative and appears stated age Lochia: appropriate Uterine Fundus: firm DVT Evaluation: No evidence of DVT seen on physical exam.   Recent Labs  05/21/17 1323 05/22/17 0424  HGB 11.1* 10.9*  HCT 32.3* 31.6*    Assessment/Plan: Plan for discharge tomorrow and Breastfeeding  Hx of depression: Has appts with SW at [facility]. Denies depression currently. Both daughters are in another country. Confirms she has family and support at home to help with this baby. - Iron def anemia: on po iron   LOS: 1 day   Sabrina DouglasBethany Ladavia Pace 05/22/2017, 11:00 AM

## 2017-05-22 NOTE — Progress Notes (Signed)
Patient pushing on hands/knees for part of this strip. Difficulty continuously monitoring FHT's and assessing contractions on toco during this time. RN and Dr. Elesa MassedWard remain at bedside during pushing efforts.

## 2017-05-22 NOTE — Discharge Summary (Signed)
Obstetrical Discharge Summary  Patient Name: Sabrina Pace DOB: Oct 30, 1996 MRN: [id number]  Date of Admission: 05/21/2017 Date of Delivery: 05/21/17 Delivered by: Ranae Plumber, MD Date of Discharge: 05/23/17 Primary OB:  [facility]  LMP: 08/04/16 EDC Estimated Date of Delivery: 05/11/17 Gestational Age at Delivery: [redacted]w[redacted]d   Antepartum complications: 1. 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 Depression, history of self harm (cutter, active in this pregnancy) 3. GBS positive 4. Chlamydia in this pregnancy 5. Possible zika exposure  - testing negative 6. History of preeclmapsia 7. Anemia 8. History of post partum hemorrhage requiring 2u PRBC (last pregnancy)  Admitting Diagnosis: labor, high risk pregnancy Secondary Diagnosis: Patient Active Problem List   Diagnosis Date Noted  . History of postpartum hemorrhage 05/22/2017  . Hx of preeclampsia, prior pregnancy, currently pregnant 05/22/2017  . GBS (group B Streptococcus carrier), +RV culture, currently pregnant 05/22/2017  . Chlamydia infection affecting pregnancy 05/22/2017  . Depression affecting pregnancy 05/22/2017  . Anemia 05/22/2017  . Labor and delivery indication for care or intervention 05/21/2017  . 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 History of blood transfusion 02/07/2017  . History of self-harm 02/07/2017    Augmentation: AROM Complications: None Intrapartum complications/course:  Mom presented to L&D with labor. She was given antibiotics for GBS prophylaxis and then AROM'd for meconium.  epidual placed. Progressed to complete, labored down for 2 hours. active second stage: ~1hour.  delivery of fetal head with restitution to ROT.   Anterior then posterior shoulders delivered without difficulty.  Baby placed on mom's chest, and attended to by peds.  We sang happy birthday to baby Dylan.   Cord was then clamped and cut after 60second delay.  Placenta spontaneously delivered, intact.   IV pitocin given for hemorrhage prophylaxis. 1st degree laceration repaired in standard fashion.  Date of Delivery: 05/21/17 Delivered By: Leeroy Bock Ward Delivery Type: spontaneous vaginal delivery Anesthesia: epidural Placenta: sponatneous Laceration: 1st degree Episiotomy: none Newborn Data: Live born female  Birth Weight:   APGAR: 8, 9  Postpartum Procedures: none Post partum course: uncomplicated  Patient had an uncomplicated postpartum course.  By time of discharge on PPD#2, her pain was controlled on oral pain medications; she had appropriate lochia and was ambulating, voiding without difficulty and tolerating regular diet.  She was deemed stable for discharge to home.     Discharge Physical Exam: 05/23/17 BP 120/80 (BP Location: Left Arm)   Pulse 86   Temp 97.8 F (36.6 C) (Oral)   Resp 18   Ht 5\' 2"  (1.575 m)   Wt 72.6 kg (160 lb)   SpO2 99%   Breastfeeding? Unknown   BMI 29.26 kg/m   General: NAD CV: RRR Pulm: CTABL, nl effort ABD: s/nd/nt, fundus firm and below the umbilicus Lochia: moderate DVT Evaluation: LE non-ttp, no evidence of DVT on exam.  Hemoglobin  Date Value Ref Range Status  05/22/2017 10.9 (L) 12.0 - 16.0 g/dL Final   HCT  Date Value Ref Range Status  05/22/2017 31.6 (L) 35.0 - 47.0 % Final     Disposition: stable, discharge to home. Baby Feeding: breastmilk Baby Disposition: home with mom  Rh Immune globulin given: n/a Rubella vaccine given: n/a Tdap vaccine given in AP or PP setting: AP Flu vaccine given in AP or PP setting: AP  Contraception: nexplanon  Prenatal Labs:  Blood type/Rh A+, Anti-Kell  Antibody screen neg  Rubella  Immune  Varicella Immune  RPR NR  HBsAg Neg  HIV NR  GC neg  Chlamydia Positive - neg test of cure  Genetic screening negative  1 hour GTT 93  3 hour GTT   GBS Positive 04/21/17     Plan:  Nelva BushNorma  Josselin Rosaria Ferriesguilar Zavala was discharged to home in good condition. Follow-up appointment at  [facility] in 6 weeks    Discharge Medications: Allergies as of 05/23/2017   No Known Allergies     Medication List    STOP taking these medications   Doxylamine-Pyridoxine 10-10 MG Tbec     TAKE these medications   ferrous sulfate 325 (65 FE) MG tablet Take 325 mg by mouth daily with breakfast.   ibuprofen 600 MG tablet Commonly known as:  ADVIL,MOTRIN Take 1 tablet (600 mg total) by mouth every 6 (six) hours.   multivitamin-prenatal 27-0.8 MG Tabs tablet Take 1 tablet by mouth daily at 12 noon.         Signed: Ihor Austinhomas J Jandi Swiger MD

## 2017-05-22 NOTE — Plan of Care (Signed)
Problem: Activity: Goal: Will verbalize the importance of balancing activity with adequate rest periods Outcome: Progressing Video Interp.: [place] [phone]

## 2017-05-23 MED ORDER — IBUPROFEN 600 MG PO TABS: 600.0000 mg | ORAL_TABLET | Freq: Four times a day (QID) | ORAL | 0 refills | Status: DC

## 2017-05-23 NOTE — Anesthesia Postprocedure Evaluation (Signed)
Anesthesia Post Note  Patient: Sabrina Pace  Procedure(s) Performed: * No procedures listed *  Patient location during evaluation: Mother Baby Anesthesia Type: Epidural Level of consciousness: awake and alert Pain management: pain level controlled Vital Signs Assessment: post-procedure vital signs reviewed and stable Respiratory status: spontaneous breathing Cardiovascular status: stable Postop Assessment: no headache and no backache Anesthetic complications: no     Last Vitals:  Vitals:   05/22/17 1548 05/22/17 1945  BP: (!) 102/54 (!) 111/53  Pulse: 75 78  Resp: 18 16  Temp: 36.7 C 37.1 C    Last Pain:  Vitals:   05/22/17 2000  TempSrc:   PainSc: 0-No pain                 Rica MastBachich,  Emmalynne Courtney M

## 2017-05-23 NOTE — Progress Notes (Signed)
Reviewed all patients discharge instructions and handouts regarding postpartum bleeding, no intercourse for 6 weeks, signs and symptoms of mastitis and postpartum bleu's. Reviewed discharge instructions for newborn regarding proper cord care, how and when to bathe the newborn, nail care, proper way to take the baby's temperature, along with safe sleep. All questions have been answered at this time. Patient discharged via wheelchair with axillary.  

## 2017-05-27 LAB — BPAM RBC
BLOOD PRODUCT EXPIRATION DATE: 201807112359
Blood Product Expiration Date: 201807152359
ISSUE DATE / TIME: 201807042103
UNIT TYPE AND RH: 6200
Unit Type and Rh: 6200

## 2017-05-27 LAB — TYPE AND SCREEN
ABO/RH(D): A POS
ANTIBODY TITER (2): 64
Antibody Screen: POSITIVE
UNIT DIVISION: 0
Unit division: 0

## 2018-02-28 ENCOUNTER — Inpatient Hospital Stay
Admission: EM | Admit: 2018-02-28 | Discharge: 2018-03-01 | DRG: 918 | Disposition: A | Discharge status: Home / Self Care | Payer: Medicaid Other | Attending: Internal Medicine | Admitting: Internal Medicine

## 2018-02-28 ENCOUNTER — Other Ambulatory Visit: Payer: Self-pay

## 2018-02-28 ENCOUNTER — Encounter: Payer: Self-pay | Admitting: Emergency Medicine

## 2018-02-28 DIAGNOSIS — Z833 Family history of diabetes mellitus: Secondary | ICD-10-CM

## 2018-02-28 DIAGNOSIS — T391X1A Poisoning by 4-Aminophenol derivatives, accidental (unintentional), initial encounter: Secondary | ICD-10-CM | POA: Diagnosis present

## 2018-02-28 DIAGNOSIS — T391X2A Poisoning by 4-Aminophenol derivatives, intentional self-harm, initial encounter: Principal | ICD-10-CM | POA: Diagnosis present

## 2018-02-28 DIAGNOSIS — T1491XA Suicide attempt, initial encounter: Secondary | ICD-10-CM

## 2018-02-28 DIAGNOSIS — Z915 Personal history of self-harm: Secondary | ICD-10-CM

## 2018-02-28 DIAGNOSIS — E876 Hypokalemia: Secondary | ICD-10-CM | POA: Diagnosis present

## 2018-02-28 DIAGNOSIS — F4325 Adjustment disorder with mixed disturbance of emotions and conduct: Secondary | ICD-10-CM | POA: Diagnosis present

## 2018-02-28 DIAGNOSIS — Z8249 Family history of ischemic heart disease and other diseases of the circulatory system: Secondary | ICD-10-CM

## 2018-02-28 DIAGNOSIS — R45851 Suicidal ideations: Secondary | ICD-10-CM

## 2018-02-28 DIAGNOSIS — T39092A Poisoning by salicylates, intentional self-harm, initial encounter: Secondary | ICD-10-CM

## 2018-02-28 LAB — COMPREHENSIVE METABOLIC PANEL
ALT: 15 U/L (ref 14–54)
ALT: 15 U/L (ref 14–54)
AST: 26 U/L (ref 15–41)
AST: 20 U/L (ref 15–41)
Albumin: 4.9 g/dL (ref 3.5–5.0)
Albumin: 4.3 g/dL (ref 3.5–5.0)
Alkaline Phosphatase: 87 U/L (ref 38–126)
Alkaline Phosphatase: 72 U/L (ref 38–126)
Anion gap: 15 (ref 5–15)
Anion gap: 12 (ref 5–15)
BUN: 13 mg/dL (ref 6–20)
BUN: 10 mg/dL (ref 6–20)
CHLORIDE: 105 mmol/L (ref 101–111)
CO2: 19 mmol/L — ABNORMAL LOW (ref 22–32)
CO2: 19 mmol/L — AB (ref 22–32)
Calcium: 9.1 mg/dL (ref 8.9–10.3)
Calcium: 7.9 mg/dL — ABNORMAL LOW (ref 8.9–10.3)
Chloride: 106 mmol/L (ref 101–111)
Creatinine, Ser: 0.55 mg/dL (ref 0.44–1.00)
Creatinine, Ser: 0.37 mg/dL — ABNORMAL LOW (ref 0.44–1.00)
GFR calc Af Amer: >60 mL/min (ref >60)
GFR calc non Af Amer: >60 mL/min (ref >60)
GFR calc non Af Amer: >60 mL/min (ref >60)
Glucose, Bld: 140 mg/dL — ABNORMAL HIGH (ref 65–99)
Glucose, Bld: 196 mg/dL — ABNORMAL HIGH (ref 65–99)
POTASSIUM: 3 mmol/L — AB (ref 3.5–5.1)
Potassium: 2.7 mmol/L — CL (ref 3.5–5.1)
SODIUM: 136 mmol/L (ref 135–145)
Sodium: 140 mmol/L (ref 135–145)
Total Bilirubin: 0.8 mg/dL (ref 0.3–1.2)
Total Bilirubin: 0.5 mg/dL (ref 0.3–1.2)
Total Protein: 8.6 g/dL — ABNORMAL HIGH (ref 6.5–8.1)
Total Protein: 7.8 g/dL (ref 6.5–8.1)

## 2018-02-28 LAB — PROTIME-INR
INR: 1.24
PROTHROMBIN TIME: 15.5 s — AB (ref 11.4–15.2)

## 2018-02-28 LAB — CBC
HCT: 38.1 % (ref 35.0–47.0)
Hemoglobin: 12.7 g/dL (ref 12.0–16.0)
MCH: 29.5 pg (ref 26.0–34.0)
MCHC: 33.2 g/dL (ref 32.0–36.0)
MCV: 88.8 fL (ref 80.0–100.0)
Platelets: 370 10*3/uL (ref 150–440)
RBC: 4.29 MIL/uL (ref 3.80–5.20)
RDW: 14.1 % (ref 11.5–14.5)
WBC: 11.9 10*3/uL — ABNORMAL HIGH (ref 3.6–11.0)

## 2018-02-28 LAB — BASIC METABOLIC PANEL
Anion gap: 12 (ref 5–15)
BUN: 9 mg/dL (ref 6–20)
CHLORIDE: 105 mmol/L (ref 101–111)
CO2: 19 mmol/L — AB (ref 22–32)
CREATININE: 0.42 mg/dL — AB (ref 0.44–1.00)
Calcium: 8.4 mg/dL — ABNORMAL LOW (ref 8.9–10.3)
GFR calc Af Amer: >60 mL/min (ref >60)
Glucose, Bld: 171 mg/dL — ABNORMAL HIGH (ref 65–99)
POTASSIUM: 2.9 mmol/L — AB (ref 3.5–5.1)
Sodium: 136 mmol/L (ref 135–145)

## 2018-02-28 LAB — URINE DRUG SCREEN, QUALITATIVE (ARMC ONLY)
Amphetamines, Ur Screen: NOT DETECTED
BARBITURATES, UR SCREEN: NOT DETECTED
Benzodiazepine, Ur Scrn: NOT DETECTED
Cannabinoid 50 Ng, Ur ~~LOC~~: NOT DETECTED
Cocaine Metabolite,Ur ~~LOC~~: NOT DETECTED
MDMA (ECSTASY) UR SCREEN: NOT DETECTED
METHADONE SCREEN, URINE: NOT DETECTED
Opiate, Ur Screen: NOT DETECTED
Phencyclidine (PCP) Ur S: NOT DETECTED
TRICYCLIC, UR SCREEN: NOT DETECTED

## 2018-02-28 LAB — ETHANOL

## 2018-02-28 LAB — BLOOD GAS, VENOUS
Acid-base deficit: 3.1 mmol/L — ABNORMAL HIGH (ref 0.0–2.0)
Bicarbonate: 21.1 mmol/L (ref 20.0–28.0)
O2 SAT: 90.2 %
PH VEN: 7.4 (ref 7.250–7.430)
Patient temperature: 37
pCO2, Ven: 34 mmHg — ABNORMAL LOW (ref 44.0–60.0)
pO2, Ven: 59 mmHg — ABNORMAL HIGH (ref 32.0–45.0)

## 2018-02-28 LAB — ACETAMINOPHEN LEVEL
ACETAMINOPHEN (TYLENOL), SERUM: 31 ug/mL — AB (ref 10–30)
ACETAMINOPHEN (TYLENOL), SERUM: 62 ug/mL — AB (ref 10–30)

## 2018-02-28 LAB — POC URINE PREG, ED: PREG TEST UR: NEGATIVE

## 2018-02-28 LAB — MRSA PCR SCREENING: MRSA BY PCR: NEGATIVE

## 2018-02-28 LAB — LACTIC ACID, PLASMA: Lactic Acid, Venous: 1.9 mmol/L (ref 0.5–1.9)

## 2018-02-28 LAB — SALICYLATE LEVEL
SALICYLATE LVL: 20.5 mg/dL (ref 2.8–30.0)
SALICYLATE LVL: 16.2 mg/dL (ref 2.8–30.0)

## 2018-02-28 MED ORDER — ACETYLCYSTEINE LOAD VIA INFUSION
150.0000 mg/kg | Freq: Once | INTRAVENOUS | Status: AC
  Administered 2018-02-28: 9600 mg via INTRAVENOUS
  Filled 2018-02-28: qty 240

## 2018-02-28 MED ORDER — ONDANSETRON HCL 4 MG/2ML IJ SOLN
4.0000 mg | Freq: Once | INTRAMUSCULAR | Status: AC
  Administered 2018-02-28: 4 mg via INTRAVENOUS
  Filled 2018-02-28: qty 2

## 2018-02-28 MED ORDER — POTASSIUM CHLORIDE IN NACL 40-0.9 MEQ/L-% IV SOLN
INTRAVENOUS | Status: AC
Start: 2018-02-28 — End: 2018-03-01
  Filled 2018-02-28: qty 1000

## 2018-02-28 MED ORDER — SODIUM CHLORIDE 0.9 % IV BOLUS
1000.0000 mL | Freq: Once | INTRAVENOUS | Status: AC
  Administered 2018-02-28: 1000 mL via INTRAVENOUS

## 2018-02-28 MED ORDER — POTASSIUM CHLORIDE 10 MEQ/100ML IV SOLN
10.0000 meq | Freq: Once | INTRAVENOUS | Status: AC
  Administered 2018-02-28: 10 meq via INTRAVENOUS
  Filled 2018-02-28: qty 100

## 2018-02-28 MED ORDER — ONDANSETRON HCL 4 MG/2ML IJ SOLN
INTRAMUSCULAR | Status: AC
  Administered 2018-02-28: 4 mg via INTRAVENOUS
  Filled 2018-02-28: qty 2

## 2018-02-28 MED ORDER — DOCUSATE SODIUM 100 MG PO CAPS: 100.0000 mg | ORAL_CAPSULE | Freq: Two times a day (BID) | ORAL | Status: DC | PRN

## 2018-02-28 MED ORDER — CHARCOAL ACTIVATED PO LIQD
50.0000 g | Freq: Once | ORAL | Status: AC
  Administered 2018-02-28: 50 g via ORAL
  Filled 2018-02-28: qty 240

## 2018-02-28 MED ORDER — ONDANSETRON HCL 4 MG/2ML IJ SOLN
4.0000 mg | Freq: Once | INTRAMUSCULAR | Status: AC
  Administered 2018-02-28: 4 mg via INTRAVENOUS

## 2018-02-28 MED ORDER — FENTANYL CITRATE (PF) 100 MCG/2ML IJ SOLN
50.0000 ug | Freq: Once | INTRAMUSCULAR | Status: AC
  Administered 2018-02-28: 50 ug via INTRAVENOUS
  Filled 2018-02-28: qty 2

## 2018-02-28 MED ORDER — ONDANSETRON HCL 4 MG/2ML IJ SOLN
4.0000 mg | Freq: Four times a day (QID) | INTRAMUSCULAR | Status: DC | PRN
  Administered 2018-02-28: 4 mg via INTRAVENOUS
  Filled 2018-02-28: qty 2

## 2018-02-28 MED ORDER — DEXTROSE 5 % IV SOLN
15.0000 mg/kg/h | INTRAVENOUS | Status: DC
  Administered 2018-02-28: 15 mg/kg/h via INTRAVENOUS
  Filled 2018-02-28: qty 200

## 2018-02-28 NOTE — H&P (Signed)
[employer] [facility] [place] [facility]   PATIENT NAME: Sabrina Pace    MR#:  [id number]  DATE OF BIRTH:  October 26, 1996  DATE OF ADMISSION:  02/28/2018  PRIMARY CARE PHYSICIAN: Department, [facility]   REQUESTING/REFERRING PHYSICIAN: Don Perking  CHIEF COMPLAINT:   Chief Complaint  Patient presents with  . Drug Overdose    HISTORY OF PRESENT ILLNESS: Sabrina Pace  is a 22 y.o. female with a known history of anemia, tried to commit suicide by taking 69 Excedrin migraine tablets ( salicylate and acetaminophen) . She was brought to emergency room within one hour so ER physician gave activated charcoal to her which is causing her nausea and vomiting since then. Also has some complaint of epigastric abdominal pain and tenderness. ER physician started on N acetylcysteine IV drip, spoke to poison control center and gave to hospitalist team for admission after IVC.  PAST MEDICAL HISTORY:   Past Medical History:  Diagnosis Date  . Anemia   . Medical history non-contributory     PAST SURGICAL HISTORY:  Past Surgical History:  Procedure Laterality Date  . APPENDECTOMY      SOCIAL HISTORY:  Social History   Tobacco Use  . Smoking status: Never Smoker  . Smokeless tobacco: Never Used  Substance Use Topics  . Alcohol use: No    FAMILY HISTORY:  Family History  Problem Relation Age of Onset  . Diabetes Mother   . Hypertension Maternal Grandmother   . Diabetes Maternal Grandmother     DRUG ALLERGIES: No Known Allergies  REVIEW OF SYSTEMS:   CONSTITUTIONAL: No fever, fatigue or weakness.  EYES: No blurred or double vision.  EARS, NOSE, AND THROAT: No tinnitus or ear pain.  RESPIRATORY: No cough, shortness of breath, wheezing or hemoptysis.  CARDIOVASCULAR: No chest pain, orthopnea, edema.  GASTROINTESTINAL: have nausea, vomiting, no diarrhea, have abdominal pain.  GENITOURINARY: No dysuria, hematuria.  ENDOCRINE: No polyuria, nocturia,   HEMATOLOGY: No anemia, easy bruising or bleeding SKIN: No rash or lesion. MUSCULOSKELETAL: No joint pain or arthritis.   NEUROLOGIC: No tingling, numbness, weakness.  PSYCHIATRY: No anxiety or depression.   MEDICATIONS AT HOME:  Prior to Admission medications   Medication Sig Start Date End Date Taking? Authorizing Provider  ferrous sulfate 325 (65 FE) MG tablet Take 325 mg by mouth daily with breakfast.    [provider]  ibuprofen (ADVIL,MOTRIN) 600 MG tablet Take 1 tablet (600 mg total) by mouth every 6 (six) hours. 05/23/17   Schermerhorn, Ihor Austin, MD  Prenatal Vit-Fe Fumarate-FA (MULTIVITAMIN-PRENATAL) 27-0.8 MG TABS tablet Take 1 tablet by mouth daily at 12 noon.    [provider]      PHYSICAL EXAMINATION:   VITAL SIGNS: Blood pressure 126/80, pulse (!) 102, temperature 97.8 F (36.6 C), temperature source Oral, resp. rate (!) 21, weight 64 kg (141 lb), last menstrual period 02/28/2018, SpO2 100 %, unknown if currently breastfeeding.  GENERAL:  22 y.o.-year-old patient lying in the bed with no acute distress.  EYES: Pupils equal, round, reactive to light and accommodation. No scleral icterus. Extraocular muscles intact.  HEENT: Head atraumatic, normocephalic. Oropharynx and nasopharynx clear.  NECK:  Supple, no jugular venous distention. No thyroid enlargement, no tenderness.  LUNGS: Normal breath sounds bilaterally, no wheezing, rales,rhonchi or crepitation. No use of accessory muscles of respiration.  CARDIOVASCULAR: S1, S2 normal. No murmurs, rubs, or gallops.  ABDOMEN: Soft, tender, nondistended. Bowel sounds present. No organomegaly or mass.  EXTREMITIES: No  pedal edema, cyanosis, or clubbing.  NEUROLOGIC: Cranial nerves II through XII are intact. Muscle strength 5/5 in all extremities. Sensation intact. Gait not checked.  PSYCHIATRIC: The patient is alert and oriented x 3.  SKIN: No obvious rash, lesion, or ulcer.   LABORATORY PANEL:   CBC Recent  Labs  Lab 02/28/18 1532  WBC 11.9*  HGB 12.7  HCT 38.1  PLT 370  MCV 88.8  MCH 29.5  MCHC 33.2  RDW 14.1   ------------------------------------------------------------------------------------------------------------------  Chemistries  Recent Labs  Lab 02/28/18 1532 02/28/18 1811  NA 140 136  K 2.7* 3.0*  CL 106 105  CO2 19* 19*  GLUCOSE 140* 196*  BUN 13 10  CREATININE 0.55 0.37*  CALCIUM 9.1 7.9*  AST 26 20  ALT 15 15  ALKPHOS 87 72  BILITOT 0.8 0.5   ------------------------------------------------------------------------------------------------------------------ CrCl cannot be calculated (Unknown ideal weight.). ------------------------------------------------------------------------------------------------------------------ No results for input(s): TSH, T4TOTAL, T3FREE, THYROIDAB in the last 72 hours.  Invalid input(s): FREET3   Coagulation profile Recent Labs  Lab 02/28/18 1811  INR 1.24   ------------------------------------------------------------------------------------------------------------------- No results for input(s): DDIMER in the last 72 hours. -------------------------------------------------------------------------------------------------------------------  Cardiac Enzymes No results for input(s): CKMB, TROPONINI, MYOGLOBIN in the last 168 hours.  Invalid input(s): CK ------------------------------------------------------------------------------------------------------------------ Invalid input(s): POCBNP  ---------------------------------------------------------------------------------------------------------------  Urinalysis    Component Value Date/Time   COLORURINE YELLOW (A) 12/17/2016 2319   APPEARANCEUR CLEAR (A) 12/17/2016 2319   LABSPEC 1.014 12/17/2016 2319   PHURINE 7.0 12/17/2016 2319   GLUCOSEU NEGATIVE 12/17/2016 2319   HGBUR NEGATIVE 12/17/2016 2319   BILIRUBINUR NEGATIVE 12/17/2016 2319   KETONESUR NEGATIVE  12/17/2016 2319   PROTEINUR NEGATIVE 12/17/2016 2319   NITRITE NEGATIVE 12/17/2016 2319   LEUKOCYTESUR SMALL (A) 12/17/2016 2319     RADIOLOGY: No results found.  EKG: Orders placed or performed during the hospital encounter of 02/28/18  . ED EKG  . ED EKG  . EKG 12-Lead  . EKG 12-Lead    IMPRESSION AND PLAN:  * intentional drug overdose   She overdose on salicylate and acetaminophen   Activated charcoal given by ER physician, and acetylcysteine IV drip started.   Follow-up liver function and coagulation profile and kidney function.   Follow up electrolytes closely.  * Suicidal ideation   Psych consult for suicidal ideation.  * hypokalemia   Replace IV.  * nausea and vomiting   Secondary to irritation of stomach and receiving activated charcoal, Zofran when necessary.  All the records are reviewed and case discussed with ED provider. Management plans discussed with the patient, family and they are in agreement.  CODE STATUS: full. Code Status History    Date Active Date Inactive Code Status Order ID Comments User Context   05/22/2017 0251 05/23/2017 1348 Full Code [id number]  Ward, Elenora Fenderhelsea C, MD Inpatient   05/21/2017 1142 05/22/2017 0251 Full Code [id number]  Ward, Elenora Fenderhelsea C, MD Inpatient       TOTAL TIME TAKING CARE OF THIS PATIENT: 50 critical care minutes.    Altamese DillingVaibhavkumar Rasheena Talmadge M.D on 02/28/2018   Between 7am to 6pm - Pager - [phone]  After 6pm go to [email or web address] - password [email or web address]  [facility] [place]  Office  [phone]  CC: Primary care physician; Department, [facility]   Note: This dictation was prepared with Dragon dictation along with smaller phrase technology. Any transcriptional errors that result from this process are unintentional.

## 2018-02-28 NOTE — ED Notes (Signed)
Patient sleeping at this time, MD gave VORB to hold fentanyl for now.

## 2018-02-28 NOTE — ED Provider Notes (Signed)
[facility] Emergency Department Provider Note  ____________________________________________  Time seen: Approximately 4:18 PM  I have reviewed the triage vital signs and the nursing notes.   HISTORY  Chief Complaint Drug Overdose   HPI Sabrina Pace is a 22 y.o. female with a history of anemia who presents after intentional overdose.  Patient reports that she took 96 tablets of Excedrin extended release migraine at 2 PM in a suicide attempt.  She did that after getting into an argument with her boyfriend.  She has a 32-month-old who is currently with her sister and is safe.  She is complaining of severe constant diffuse cramping abdominal pain.  She has vomited 5 times.  She denies any other coingestions.  She denies any prior history of depression or suicide attempt.  Past Medical History:  Diagnosis Date  . Anemia   . Medical history non-contributory     Patient Active Problem List   Diagnosis Date Noted  . Tylenol toxicity 02/28/2018  . History of postpartum hemorrhage 05/22/2017  . Hx of preeclampsia, prior pregnancy, currently pregnant 05/22/2017  . GBS (group B Streptococcus carrier), +RV culture, currently pregnant 05/22/2017  . Chlamydia infection affecting pregnancy 05/22/2017  . Depression affecting pregnancy 05/22/2017  . Anemia 05/22/2017  . Labor and delivery indication for care or intervention 05/21/2017  . 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 History of blood transfusion 02/07/2017  . History of self-harm 02/07/2017    Past Surgical History:  Procedure Laterality Date  . APPENDECTOMY      Prior to Admission medications   Medication Sig Start Date End Date Taking? Authorizing Provider  ferrous sulfate 325 (65 FE) MG tablet Take 325 mg by mouth daily with breakfast.    [provider]  ibuprofen (ADVIL,MOTRIN) 600 MG tablet Take 1 tablet (600 mg total) by mouth  every 6 (six) hours. 05/23/17   Schermerhorn, Ihor Austin, MD  Prenatal Vit-Fe Fumarate-FA (MULTIVITAMIN-PRENATAL) 27-0.8 MG TABS tablet Take 1 tablet by mouth daily at 12 noon.    [provider]    Allergies Patient has no known allergies.  Family History  Problem Relation Age of Onset  . Diabetes Mother   . Hypertension Maternal Grandmother   . Diabetes Maternal Grandmother     Social History Social History   Tobacco Use  . Smoking status: Never Smoker  . Smokeless tobacco: Never Used  Substance Use Topics  . Alcohol use: No  . Drug use: No    Review of Systems  Constitutional: Negative for fever. Eyes: Negative for visual changes. ENT: Negative for sore throat. Neck: No neck pain  Cardiovascular: Negative for chest pain. Respiratory: Negative for shortness of breath. Gastrointestinal: + abdominal pain, nausea, and vomiting. No diarrhea. Genitourinary: Negative for dysuria. Musculoskeletal: Negative for back pain. Skin: Negative for rash. Neurological: Negative for headaches, weakness or numbness. Psych: + depression and suicide attempt  ____________________________________________   PHYSICAL EXAM:  VITAL SIGNS: ED Triage Vitals  Enc Vitals Group     BP 02/28/18 1529 115/83     Pulse Rate 02/28/18 1529 90     Resp 02/28/18 1529 16     Temp 02/28/18 1529 97.8 F (36.6 C)     Temp Source 02/28/18 1529 Oral     SpO2 02/28/18 1529 98 %     Weight 02/28/18 1530 141 lb (64 kg)     Height --      Head Circumference --  Peak Flow --      Pain Score 02/28/18 1529 0     Pain Loc --      Pain Edu? --      Excl. in GC? --     Constitutional: Alert and oriented, crying in distress due to abdominal pain.  HEENT:      Head: Normocephalic and atraumatic.         Eyes: Conjunctivae are normal. Sclera is non-icteric.       Mouth/Throat: Mucous membranes are moist.       Neck: Supple with no signs of meningismus. Cardiovascular: Regular rate and rhythm.  No murmurs, gallops, or rubs. 2+ symmetrical distal pulses are present in all extremities. No JVD. Respiratory: Normal respiratory effort. Lungs are clear to auscultation bilaterally. No wheezes, crackles, or rhonchi.  Gastrointestinal: Soft, diffusely tender to palpation, and non distended with positive bowel sounds. No rebound or guarding. Musculoskeletal: Nontender with normal range of motion in all extremities. No edema, cyanosis, or erythema of extremities. Neurologic: Normal speech and language. Face is symmetric. Moving all extremities. No gross focal neurologic deficits are appreciated. Skin: Skin is warm, dry and intact. No rash noted. Psychiatric: Mood and affect are depressed.  ____________________________________________   LABS (all labs ordered are listed, but only abnormal results are displayed)  Labs Reviewed  COMPREHENSIVE METABOLIC PANEL - Abnormal; Notable for the following components:      Result Value   Potassium 2.7 (*)    CO2 19 (*)    Glucose, Bld 140 (*)    Total Protein 8.6 (*)    All other components within normal limits  ACETAMINOPHEN LEVEL - Abnormal; Notable for the following components:   Acetaminophen (Tylenol), Serum 62 (*)    All other components within normal limits  CBC - Abnormal; Notable for the following components:   WBC 11.9 (*)    All other components within normal limits  ACETAMINOPHEN LEVEL - Abnormal; Notable for the following components:   Acetaminophen (Tylenol), Serum 31 (*)    All other components within normal limits  BLOOD GAS, VENOUS - Abnormal; Notable for the following components:   pCO2, Ven 34 (*)    pO2, Ven 59.0 (*)    Acid-base deficit 3.1 (*)    All other components within normal limits  COMPREHENSIVE METABOLIC PANEL - Abnormal; Notable for the following components:   Potassium 3.0 (*)    CO2 19 (*)    Glucose, Bld 196 (*)    Creatinine, Ser 0.37 (*)    Calcium 7.9 (*)    All other components within normal limits    PROTIME-INR - Abnormal; Notable for the following components:   Prothrombin Time 15.5 (*)    All other components within normal limits  MRSA PCR SCREENING  ETHANOL  SALICYLATE LEVEL  URINE DRUG SCREEN, QUALITATIVE ([facility] ONLY)  LACTIC ACID, PLASMA  SALICYLATE LEVEL  COMPREHENSIVE METABOLIC PANEL  PROTIME-INR  CBC  BASIC METABOLIC PANEL  SALICYLATE LEVEL  ACETAMINOPHEN LEVEL  HIV ANTIBODY (ROUTINE TESTING)  ACETAMINOPHEN LEVEL  SALICYLATE LEVEL  POC URINE PREG, ED   ____________________________________________  EKG  ED ECG REPORT I, Nita Sickle, the attending physician, personally viewed and interpreted this ECG.  Normal sinus rhythm, rate of 89, normal intervals, normal axis, no ST elevations or depressions. ____________________________________________  RADIOLOGY  none  ____________________________________________   PROCEDURES  Procedure(s) performed: None Procedures Critical Care performed: yes  CRITICAL CARE Performed by: Nita Sickle  ?  Total critical care time: 40 min  Critical care time was exclusive of separately billable procedures and treating other patients.  Critical care was necessary to treat or prevent imminent or life-threatening deterioration.  Critical care was time spent personally by me on the following activities: development of treatment plan with patient and/or surrogate as well as nursing, discussions with consultants, evaluation of patient's response to treatment, examination of patient, obtaining history from patient or surrogate, ordering and performing treatments and interventions, ordering and review of laboratory studies, ordering and review of radiographic studies, pulse oximetry and re-evaluation of patient's condition.  ____________________________________________   INITIAL IMPRESSION / ASSESSMENT AND PLAN / ED COURSE  22 y.o. female with a history of anemia who presents after intentional overdose. Patient took 6796  Excedrin extended release migraine containing 250mg  of acetaminophen, 250 mg of salicylate, and 65 mg of caffeine each tablet. Patient with a total ingestion of 24g of acetaminophen and salicylate. Patient given charcoal, started on NAC. EKG showing normal intervals. Patient given zofran for n/v. Poison control has been informed and agrees with current management. Labs showing K 2.7 which is being supplemented IV. Acetaminophen level 62, salicylate 20.5 1.5 hours post-ingestion, will repeat at 4 hrs post-ingestion. No indication for urine alkalinization or HD at this time. Patient IVC'ed. Sitter at the bedside.      As part of my medical decision making, I reviewed the following data within the electronic MEDICAL RECORD NUMBER Nursing notes reviewed and incorporated, Labs reviewed , EKG interpreted , Discussed with admitting physician , Notes from prior ED visits and [place] Controlled Substance Database    Pertinent labs & imaging results that were available during my care of the patient were reviewed by me and considered in my medical decision making (see chart for details).    ____________________________________________   FINAL CLINICAL IMPRESSION(S) / ED DIAGNOSES  Final diagnoses:  Suicide attempt (HCC)  Intentional acetaminophen overdose, initial encounter (HCC)  Salicylate overdose, intentional self-harm, initial encounter (HCC)      NEW MEDICATIONS STARTED DURING THIS VISIT:  ED Discharge Orders    None       Note:  This document was prepared using Dragon voice recognition software and may include unintentional dictation errors.    Don PerkingVeronese, [place], MD 02/28/18 2245

## 2018-02-28 NOTE — Progress Notes (Signed)
Admission Navigator completed with patient via Spanish Interpreter

## 2018-02-28 NOTE — Progress Notes (Signed)
MEDICATION RELATED CONSULT NOTE - INITIAL   Pharmacy Consult for Acetylcysteine Indication: acetaminophen overdose  No Known Allergies  Patient Measurements: Weight: 141 lb (64 kg)   Vital Signs: Temp: 97.8 F (36.6 C) (04/09 1529) Temp Source: Oral (04/09 1529) BP: 126/80 (04/09 1630) Pulse Rate: 106 (04/09 1630) Intake/Output from previous day: No intake/output data recorded. Intake/Output from this shift: No intake/output data recorded.  Labs: Recent Labs    02/28/18 1532  WBC 11.9*  HGB 12.7  HCT 38.1  PLT 370  CREATININE 0.55  ALBUMIN 4.9  PROT 8.6*  AST 26  ALT 15  ALKPHOS 87  BILITOT 0.8   CrCl cannot be calculated (Unknown ideal weight.).  Assessment: Pt presents after ingesting 96 excedrin extra strength tabs Baseline labs ordered Acetylcysteine started 4/9 at 1654  Plan:  Will order INR as part of baseline labs Acetylcysteine ordered by MD, Poision Control Center contacted by MD  Will order baseline labs for 22 h after start of infusion -  AST/ALT, acetaminophen level Will need to also order INR and BMET for 22 h after start of infusion if AST/ALT become elevated or if pt has sxs.  Follow labs  Follow up need to continue NAC therapy per poison control guidelines     Marty HeckWang, Christon Gallaway L 02/28/2018,5:23 PM

## 2018-02-28 NOTE — ED Notes (Signed)
Mother visiting at bedside.  Was wanded by security.

## 2018-02-28 NOTE — Progress Notes (Signed)
eLink Physician-Brief Progress Note Patient Name: Sabrina Pace DOB: 01/14/1996 MRN: [id number]   Date of Service  02/28/2018  HPI/Events of Note  22 yo female with intentional OD of 4576 Exedrin tablets (24 gm ASA and 24 gm Tylenol. Presented 1 hour post ingestion. Given Charcoal and now on N - Acetylcysteine IV infusion. Not intubated at this time. VSS.  eICU Interventions  No new orders.      Intervention Category Evaluation Type: New Patient Evaluation  Lenell AntuSommer,Steven Eugene 02/28/2018, 9:22 PM

## 2018-02-28 NOTE — ED Notes (Signed)
IVC/Patient will be admitted medically

## 2018-02-28 NOTE — ED Triage Notes (Addendum)
[place] [facility] , pt took 96 excerdrin extra strength at aprox 1400 today.. PT upset because of recent breakup. Per EMS VSS on scene, pt was going to go to [facility] but pt started acting out in car. Pt has minor cuts to left arm per officer, bleeding controlled. Per officer pt has been " putting her finger in her mouth to vomit" . VSS . Pt denies any drug use.  PT has language barrier, interpreter used.

## 2018-03-01 DIAGNOSIS — F4325 Adjustment disorder with mixed disturbance of emotions and conduct: Secondary | ICD-10-CM

## 2018-03-01 DIAGNOSIS — T391X1A Poisoning by 4-Aminophenol derivatives, accidental (unintentional), initial encounter: Secondary | ICD-10-CM

## 2018-03-01 DIAGNOSIS — R45851 Suicidal ideations: Secondary | ICD-10-CM

## 2018-03-01 HISTORY — DX: Poisoning by 4-aminophenol derivatives, accidental (unintentional), initial encounter: T39.1X1A

## 2018-03-01 LAB — CBC
HCT: 36.2 % (ref 35.0–47.0)
Hemoglobin: 12.1 g/dL (ref 12.0–16.0)
MCH: 29.8 pg (ref 26.0–34.0)
MCHC: 33.5 g/dL (ref 32.0–36.0)
MCV: 88.8 fL (ref 80.0–100.0)
PLATELETS: 331 10*3/uL (ref 150–440)
RBC: 4.07 MIL/uL (ref 3.80–5.20)
RDW: 13.8 % (ref 11.5–14.5)
WBC: 15.8 10*3/uL — ABNORMAL HIGH (ref 3.6–11.0)

## 2018-03-01 LAB — COMPREHENSIVE METABOLIC PANEL
ALBUMIN: 4.7 g/dL (ref 3.5–5.0)
ALT: 15 U/L (ref 14–54)
AST: 17 U/L (ref 15–41)
Alkaline Phosphatase: 79 U/L (ref 38–126)
Anion gap: 10 (ref 5–15)
BILIRUBIN TOTAL: 0.5 mg/dL (ref 0.3–1.2)
BUN: 8 mg/dL (ref 6–20)
CO2: 22 mmol/L (ref 22–32)
Calcium: 8.6 mg/dL — ABNORMAL LOW (ref 8.9–10.3)
Chloride: 105 mmol/L (ref 101–111)
Creatinine, Ser: 0.4 mg/dL — ABNORMAL LOW (ref 0.44–1.00)
GFR calc Af Amer: >60 mL/min (ref >60)
GFR calc non Af Amer: >60 mL/min (ref >60)
GLUCOSE: 119 mg/dL — AB (ref 65–99)
Potassium: 3.3 mmol/L — ABNORMAL LOW (ref 3.5–5.1)
Sodium: 137 mmol/L (ref 135–145)
TOTAL PROTEIN: 8.5 g/dL — AB (ref 6.5–8.1)

## 2018-03-01 LAB — SALICYLATE LEVEL
SALICYLATE LVL: 13.4 mg/dL (ref 2.8–30.0)
Salicylate Lvl: 10.3 mg/dL (ref 2.8–30.0)

## 2018-03-01 LAB — ACETAMINOPHEN LEVEL
Acetaminophen (Tylenol), Serum: 11 ug/mL (ref 10–30)
Acetaminophen (Tylenol), Serum: <10 ug/mL — ABNORMAL LOW (ref 10–30)

## 2018-03-01 LAB — PROTIME-INR
INR: 0.98
Prothrombin Time: 12.9 seconds (ref 11.4–15.2)

## 2018-03-01 LAB — AST: AST: 16 U/L (ref 15–41)

## 2018-03-01 LAB — ALT: ALT: 14 U/L (ref 14–54)

## 2018-03-01 LAB — GLUCOSE, CAPILLARY: GLUCOSE-CAPILLARY: 163 mg/dL — AB (ref 65–99)

## 2018-03-01 MED ORDER — POTASSIUM CHLORIDE 10 MEQ/100ML IV SOLN
10.0000 meq | INTRAVENOUS | Status: DC
  Administered 2018-03-01 (×2): 10 meq via INTRAVENOUS
  Filled 2018-03-01 (×6): qty 100

## 2018-03-01 MED ORDER — POTASSIUM CHLORIDE CRYS ER 20 MEQ PO TBCR
40.0000 meq | EXTENDED_RELEASE_TABLET | ORAL | Status: AC
  Administered 2018-03-01 (×2): 40 meq via ORAL
  Filled 2018-03-01 (×2): qty 2

## 2018-03-01 NOTE — Progress Notes (Signed)
RN stated patient was asking if she could breast feed. Mucomyst was stopped at 0100 this AM. Lack of data as to if drug is secreted into breast milk. However, we do know that drug is completely cleared from the body in 30 hours. Therefore it was recommendeded that the patient pump and dump until tomorrow AM (30 hours after infusion was stopped)  Olene FlossMelissa D Maccia, Pharm.D, BCPS Clinical Pharmacist

## 2018-03-01 NOTE — Progress Notes (Signed)
Pt unable to tolerate  IV potassium, even after slowing the rate down and adding normal saline running with it. MD paged, Dr. Sheryle Hailiamond to D/C IV potassium and switch to PO. Will give and continue to monitor.

## 2018-03-01 NOTE — Progress Notes (Signed)
[facility]                                                                                                                                                                                  Patient Demographics   Sabrina Pace, is a 22 y.o. female, DOB - 01/03/1996, ZOX:[id number]  Admit date - 02/28/2018   Admitting Physician Altamese DillingVaibhavkumar Vachhani, MD  Outpatient Primary MD for the patient is Department, [facility]   LOS - 1  Subjective: Patient admitted with salicylate and acetaminophen overdose. She is currently feeling well denies any abdominal pain nausea vomiting.   Review of Systems:   CONSTITUTIONAL: No documented fever. No fatigue, weakness. No weight gain, no weight loss.  EYES: No blurry or double vision.  ENT: No tinnitus. No postnasal drip. No redness of the oropharynx.  RESPIRATORY: No cough, no wheeze, no hemoptysis. No dyspnea.  CARDIOVASCULAR: No chest pain. No orthopnea. No palpitations. No syncope.  GASTROINTESTINAL: No nausea, no vomiting or diarrhea. No abdominal pain. No melena or hematochezia.  GENITOURINARY: No dysuria or hematuria.  ENDOCRINE: No polyuria or nocturia. No heat or cold intolerance.  HEMATOLOGY: No anemia. No bruising. No bleeding.  INTEGUMENTARY: No rashes. No lesions.  MUSCULOSKELETAL: No arthritis. No swelling. No gout.  NEUROLOGIC: No numbness, tingling, or ataxia. No seizure-type activity.  PSYCHIATRIC: No anxiety. No insomnia. No ADD.    Vitals:   Vitals:   03/01/18 0200 03/01/18 0304 03/01/18 0308 03/01/18 0751  BP: 109/69  100/71 (!) 112/54  Pulse: 69  70 76  Resp: 17  20 18   Temp:   97.8 F (36.6 C) 98.4 F (36.9 C)  TempSrc:   Oral Oral  SpO2: 97%  100% 100%  Weight:  62 kg (136 lb 9.6 oz)    Height:  5\' 1"  (1.549 m)      Wt Readings from Last 3 Encounters:  03/01/18 62 kg (136 lb 9.6 oz)  05/21/17 72.6 kg (160 lb)  03/03/17 70.9 kg (156 lb 3.2 oz)      Intake/Output Summary (Last 24 hours) at 03/01/2018 1432 Last data filed at 03/01/2018 0700 Gross per 24 hour  Intake 569.2 ml  Output 0 ml  Net 569.2 ml    Physical Exam:   GENERAL: Pleasant-appearing in no apparent distress.  HEAD, EYES, EARS, NOSE AND THROAT: Atraumatic, normocephalic. Extraocular muscles are intact. Pupils equal and reactive to light. Sclerae anicteric. No conjunctival injection. No oro-pharyngeal erythema.  NECK: Supple. There is no jugular venous distention. No bruits, no lymphadenopathy, no thyromegaly.  HEART: Regular rate and rhythm,. No murmurs, no rubs, no clicks.  LUNGS: Clear to auscultation  bilaterally. No rales or rhonchi. No wheezes.  ABDOMEN: Soft, flat, nontender, nondistended. Has good bowel sounds. No hepatosplenomegaly appreciated.  EXTREMITIES: No evidence of any cyanosis, clubbing, or peripheral edema.  +2 pedal and radial pulses bilaterally.  NEUROLOGIC: The patient is alert, awake, and oriented x3 with no focal motor or sensory deficits appreciated bilaterally.  SKIN: Moist and warm with no rashes appreciated.  Psych: Not anxious, depressed LN: No inguinal LN enlargement    Antibiotics   Anti-infectives (From admission, onward)   None      Medications   Scheduled Meds: Continuous Infusions: PRN Meds:.docusate sodium, ondansetron (ZOFRAN) IV   Data Review:   Micro Results Recent Results (from the past 240 hour(s))  MRSA PCR Screening     Status: None   Collection Time: 02/28/18  9:34 PM  Result Value Ref Range Status   MRSA by PCR NEGATIVE NEGATIVE Final    Comment:        The GeneXpert MRSA Assay (FDA approved for NASAL specimens only), is one component of a comprehensive MRSA colonization surveillance program. It is not intended to diagnose MRSA infection nor to guide or monitor treatment for MRSA infections. Performed at [facility], [address], [place], [place] [address]     Radiology  Reports No results found.   CBC Recent Labs  Lab 02/28/18 1532 03/01/18 0333  WBC 11.9* 15.8*  HGB 12.7 12.1  HCT 38.1 36.2  PLT 370 331  MCV 88.8 88.8  MCH 29.5 29.8  MCHC 33.2 33.5  RDW 14.1 13.8    Chemistries  Recent Labs  Lab 02/28/18 1532 02/28/18 1811 02/28/18 2316 03/01/18 0333  NA 140 136 136 137  K 2.7* 3.0* 2.9* 3.3*  CL 106 105 105 105  CO2 19* 19* 19* 22  GLUCOSE 140* 196* 171* 119*  BUN 13 10 9 8   CREATININE 0.55 0.37* 0.42* 0.40*  CALCIUM 9.1 7.9* 8.4* 8.6*  AST 26 20  --  17  ALT 15 15  --  15  ALKPHOS 87 72  --  79  BILITOT 0.8 0.5  --  0.5   ------------------------------------------------------------------------------------------------------------------ estimated creatinine clearance is 91.6 mL/min (A) (by C-G formula based on SCr of 0.4 mg/dL (L)). ------------------------------------------------------------------------------------------------------------------ No results for input(s): HGBA1C in the last 72 hours. ------------------------------------------------------------------------------------------------------------------ No results for input(s): CHOL, HDL, LDLCALC, TRIG, CHOLHDL, LDLDIRECT in the last 72 hours. ------------------------------------------------------------------------------------------------------------------ No results for input(s): TSH, T4TOTAL, T3FREE, THYROIDAB in the last 72 hours.  Invalid input(s): FREET3 ------------------------------------------------------------------------------------------------------------------ No results for input(s): VITAMINB12, FOLATE, FERRITIN, TIBC, IRON, RETICCTPCT in the last 72 hours.  Coagulation profile Recent Labs  Lab 02/28/18 1811 03/01/18 0333  INR 1.24 0.98    No results for input(s): DDIMER in the last 72 hours.  Cardiac Enzymes No results for input(s): CKMB, TROPONINI, MYOGLOBIN in the last 168 hours.  Invalid input(s):  CK ------------------------------------------------------------------------------------------------------------------ Invalid input(s): POCBNP    Assessment & Plan   *intentional drug overdose   She overdose on salicylate and acetaminophen   Activated charcoal given by ER physician, and acetylcysteine discontinued     tylenol levels normal     * Suicidal ideation   Psych consult for suicidal ideation.  * hypokalemia  Replace potassium  * nausea and vomiting   Secondary to irritation of stomach and receiving activated charcoal, Zofran when necessary.  *Leukocytosis reactive       Code Status Orders  (From admission, onward)        Start  Ordered   02/28/18 2123  Full code  Continuous     02/28/18 2122    Code Status History    Date Active Date Inactive Code Status Order ID Comments User Context   05/22/2017 0251 05/23/2017 1348 Full Code [id number]  Ward, Elenora Fender, MD Inpatient   05/21/2017 1142 05/22/2017 0251 Full Code [id number]  Ward, Elenora Fender, MD Inpatient           Consults  none   DVT Prophylaxis  scd's  Lab Results  Component Value Date   PLT 331 03/01/2018     Time Spent in minutes    Greater than 50% of time spent in care coordination and counseling patient regarding the condition and plan of care.   Auburn Bilberry M.D on 03/01/2018 at 2:32 PM  Between 7am to 6pm - Pager - [phone]  After 6pm go to [email or web address] - Social research officer, government  [employer]   Office  [phone]

## 2018-03-01 NOTE — Progress Notes (Signed)
Transferred patient out of ICU per sign out, tylenol and salicylate levels down, no ICU provider tonight so I placed the order for transfer.  Kristeen MissWILLIS, Brianca Fortenberry FIELDING [facility] 03/01/2018, 12:54 AM

## 2018-03-01 NOTE — Progress Notes (Signed)
Sabrina Pace Sabrina Pace to be D/C'd Home per MD order.  Discussed prescriptions and follow up appointments with the patient.[facility]. Pt verbalized understanding.  Allergies as of 03/01/2018   No Known Allergies     Medication List    STOP taking these medications   ibuprofen 600 MG tablet Commonly known as:  ADVIL,MOTRIN       Vitals:   03/01/18 0751 03/01/18 1702  BP: (!) 112/54 104/65  Pulse: 76 71  Resp: 18 18  Temp: 98.4 F (36.9 C)   SpO2: 100% 99%    Tele box removed and returned.Skin clean, dry and intact without evidence of skin break down, no evidence of skin tears noted. IV catheter discontinued intact. Site without signs and symptoms of complications. Dressing and pressure applied. Pt denies pain at this time. No complaints noted.  An After Visit Summary was printed and given to the patient. Patient escorted via WC, and D/C home via private auto.  Sabrina NoelErica Y Raschelle Pace

## 2018-03-01 NOTE — Progress Notes (Signed)
Pt having difficulty with burning sensation with peripheral potassium.   Rate decreased for comfort.

## 2018-03-01 NOTE — Consult Note (Signed)
[facility] Consult   Reason for Consult: Consult for 22 year old woman who came to the emergency room yesterday after intentionally overdosing on medication Referring Physician: Posey Pronto Patient Identification: Sabrina Pace MRN:  [id number] Principal Diagnosis: Adjustment disorder with mixed disturbance of emotions and conduct Diagnosis:   Patient Active Problem List   Diagnosis Date Noted  . Adjustment disorder with mixed disturbance of emotions and conduct [F43.25] 03/01/2018  . Overdose by acetaminophen [T39.1X1A] 03/01/2018  . Suicidal ideation [R45.851] 03/01/2018  . Tylenol toxicity [T39.1X1A] 02/28/2018  . History of postpartum hemorrhage [Z86.2] 05/22/2017  . Hx of preeclampsia, prior pregnancy, currently pregnant [O09.299] 05/22/2017  . GBS (group B Streptococcus carrier), +RV culture, currently pregnant [O99.820] 05/22/2017  . Chlamydia infection affecting pregnancy [O98.819, A74.9] 05/22/2017  . Depression affecting pregnancy [O99.340, F32.9] 05/22/2017  . Anemia [D64.9] 05/22/2017  . Labor and delivery indication for care or intervention [O75.9] 05/21/2017  . 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 History of blood transfusion [Z92.89] 02/07/2017  . History of self-harm [Z91.5] 02/07/2017    Total Time spent with patient: 1 hour  Subjective:   Sabrina Pace is a 22 y.o. female patient admitted with "I took a bunch of pills".  HPI: Patient interviewed with the assistance of hospital provided Spanish language interpreter.  Chart reviewed.  22 year old woman came to the emergency room last night stating that she had taken an overdose of somewhere between 75 and 100 tablets of Excedrin.  After taking them the patient immediately notified her boyfriend of what she had done and was cooperative with coming into the hospital.  On interview today the patient says this was an impulsive  decision in the middle of an argument she was having with her boyfriend.  She says he had threatened to leave her which is why she took the overdose.  She says that she does not actually want to die and is not clear even at the moment she was taking them whether she expected to die.  She denies having been depressed prior to this.  Denies having had any suicidal ideation leading up to it.  Denies any psychotic symptoms.  He has been feeling under a lot of stress in the relationship at home recently.  Was not drinking at the time or abusing any drugs.  Does not get any mental health treatment.  Social history: Patient lives with her boyfriend and the young child they share.  She works outside the home regularly.  She and her boyfriend have been arguing more recently and he had said to her that he was going to leave her which is what set her off.  Medical history: Patient has 3 children and the youngest one about 9 months.  No ongoing medical problems not prescribed any regular medication.  Substance abuse history: Denies alcohol or drug abuse.  Past Psychiatric History: Patient minimized psychiatric history to me although review of the chart suggest she has a past history of cutting when she was a teenager.  Patient denies any previous psychiatric hospitalization.  No history of psychiatric medicine.  Risk to Self: Is patient at risk for suicide?: Yes Risk to Others:   Prior Inpatient Therapy:   Prior Outpatient Therapy:    Past Medical History:  Past Medical History:  Diagnosis Date  . Anemia   . Medical history non-contributory     Past Surgical History:  Procedure Laterality Date  . APPENDECTOMY     Family  History:  Family History  Problem Relation Age of Onset  . Diabetes Mother   . Hypertension Maternal Grandmother   . Diabetes Maternal Grandmother    Family Psychiatric  History: Denies any family history Social History:  Social History   Substance and Sexual Activity   Alcohol Use No     Social History   Substance and Sexual Activity  Drug Use No    Social History   Socioeconomic History  . Marital status: Single    Spouse name: Not on file  . Number of children: Not on file  . Years of education: Not on file  . Highest education level: Not on file  Occupational History  . Not on file  Social Needs  . Financial resource strain: Not on file  . Food insecurity:    Worry: Not on file    Inability: Not on file  . Transportation needs:    Medical: Not on file    Non-medical: Not on file  Tobacco Use  . Smoking status: Never Smoker  . Smokeless tobacco: Never Used  Substance and Sexual Activity  . Alcohol use: No  . Drug use: No  . Sexual activity: Yes  Lifestyle  . Physical activity:    Days per week: Not on file    Minutes per session: Not on file  . Stress: Not on file  Relationships  . Social connections:    Talks on phone: Not on file    Gets together: Not on file    Attends religious service: Not on file    Active member of club or organization: Not on file    Attends meetings of clubs or organizations: Not on file    Relationship status: Not on file  Other Topics Concern  . Not on file  Social History Narrative  . Not on file   Additional Social History:    Allergies:  No Known Allergies  Labs:  Results for orders placed or performed during the hospital encounter of 02/28/18 (from the past 48 hour(s))  Comprehensive metabolic panel     Status: Abnormal   Collection Time: 02/28/18  3:32 PM  Result Value Ref Range   Sodium 140 135 - 145 mmol/L   Potassium 2.7 (LL) 3.5 - 5.1 mmol/L    Comment: CRITICAL RESULT CALLED TO, READ BACK BY AND VERIFIED WITH JAMIE COHEN 02/28/18 1610 KLW    Chloride 106 101 - 111 mmol/L   CO2 19 (L) 22 - 32 mmol/L   Glucose, Bld 140 (H) 65 - 99 mg/dL   BUN 13 6 - 20 mg/dL   Creatinine, Ser 0.55 0.44 - 1.00 mg/dL   Calcium 9.1 8.9 - 10.3 mg/dL   Total Protein 8.6 (H) 6.5 - 8.1 g/dL    Albumin 4.9 3.5 - 5.0 g/dL   AST 26 15 - 41 U/L   ALT 15 14 - 54 U/L   Alkaline Phosphatase 87 38 - 126 U/L   Total Bilirubin 0.8 0.3 - 1.2 mg/dL   GFR calc non Af Amer >60 >60 mL/min   GFR calc Af Amer >60 >60 mL/min    Comment: (NOTE) The eGFR has been calculated using the CKD EPI equation. This calculation has not been validated in all clinical situations. eGFR's persistently <60 mL/min signify possible Chronic Kidney Disease.    Anion gap 15 5 - 15    Comment: Performed at [facility], [address] [place], [place] [address]  Ethanol     Status:  None   Collection Time: 02/28/18  3:32 PM  Result Value Ref Range   Alcohol, Ethyl (B) <10 <10 mg/dL    Comment:        LOWEST DETECTABLE LIMIT FOR SERUM ALCOHOL IS 10 mg/dL FOR MEDICAL PURPOSES ONLY Performed at [facility], [address] [place], [place] [address]   Salicylate level     Status: None   Collection Time: 02/28/18  3:32 PM  Result Value Ref Range   Salicylate Lvl 53.6 2.8 - 30.0 mg/dL    Comment: Performed at [facility], [address] [place], [place] [address]  Acetaminophen level     Status: Abnormal   Collection Time: 02/28/18  3:32 PM  Result Value Ref Range   Acetaminophen (Tylenol), Serum 62 (H) 10 - 30 ug/mL    Comment:        THERAPEUTIC CONCENTRATIONS VARY SIGNIFICANTLY. A RANGE OF 10-30 ug/mL MAY BE AN EFFECTIVE CONCENTRATION FOR MANY PATIENTS. HOWEVER, SOME ARE BEST TREATED AT CONCENTRATIONS OUTSIDE THIS RANGE. ACETAMINOPHEN CONCENTRATIONS >150 ug/mL AT 4 HOURS AFTER INGESTION AND >50 ug/mL AT 12 HOURS AFTER INGESTION ARE OFTEN ASSOCIATED WITH TOXIC REACTIONS. Performed at [facility], [address] [place], [place] [address]   cbc     Status: Abnormal   Collection Time: 02/28/18  3:32 PM  Result Value Ref Range   WBC 11.9 (H) 3.6 - 11.0 K/uL   RBC 4.29 3.80 - 5.20 MIL/uL   Hemoglobin 12.7 12.0 - 16.0 g/dL   HCT 38.1 35.0 - 47.0 %   MCV  88.8 80.0 - 100.0 fL   MCH 29.5 26.0 - 34.0 pg   MCHC 33.2 32.0 - 36.0 g/dL   RDW 14.1 11.5 - 14.5 %   Platelets 370 150 - 440 K/uL    Comment: Performed at [facility], [address], [place], [place] [address]  Urine Drug Screen, Qualitative     Status: None   Collection Time: 02/28/18  4:29 PM  Result Value Ref Range   Tricyclic, Ur Screen NONE DETECTED NONE DETECTED   Amphetamines, Ur Screen NONE DETECTED NONE DETECTED   MDMA (Ecstasy)Ur Screen NONE DETECTED NONE DETECTED   Cocaine Metabolite,Ur McKenna NONE DETECTED NONE DETECTED   Opiate, Ur Screen NONE DETECTED NONE DETECTED   Phencyclidine (PCP) Ur S NONE DETECTED NONE DETECTED   Cannabinoid 50 Ng, Ur [place] NONE DETECTED NONE DETECTED   Barbiturates, Ur Screen NONE DETECTED NONE DETECTED   Benzodiazepine, Ur Scrn NONE DETECTED NONE DETECTED   Methadone Scn, Ur NONE DETECTED NONE DETECTED    Comment: (NOTE) Tricyclics + metabolites, urine    Cutoff 1000 ng/mL Amphetamines + metabolites, urine  Cutoff 1000 ng/mL MDMA (Ecstasy), urine              Cutoff 500 ng/mL Cocaine Metabolite, urine          Cutoff 300 ng/mL Opiate + metabolites, urine        Cutoff 300 ng/mL Phencyclidine (PCP), urine         Cutoff 25 ng/mL Cannabinoid, urine                 Cutoff 50 ng/mL Barbiturates + metabolites, urine  Cutoff 200 ng/mL Benzodiazepine, urine              Cutoff 200 ng/mL Methadone, urine                   Cutoff 300 ng/mL The urine drug screen  provides only a preliminary, unconfirmed analytical test result and should not be used for non-medical purposes. Clinical consideration and professional judgment should be applied to any positive drug screen result due to possible interfering substances. A more specific alternate chemical method must be used in order to obtain a confirmed analytical result. Gas chromatography / mass spectrometry (GC/MS) is the preferred confirmat ory method. Performed at [facility], [address] [place], [place] [address]   Acetaminophen level     Status: Abnormal   Collection Time: 02/28/18  4:29 PM  Result Value Ref Range   Acetaminophen (Tylenol), Serum 31 (H) 10 - 30 ug/mL    Comment:        THERAPEUTIC CONCENTRATIONS VARY SIGNIFICANTLY. A RANGE OF 10-30 ug/mL MAY BE AN EFFECTIVE CONCENTRATION FOR MANY PATIENTS. HOWEVER, SOME ARE BEST TREATED AT CONCENTRATIONS OUTSIDE THIS RANGE. ACETAMINOPHEN CONCENTRATIONS >150 ug/mL AT 4 HOURS AFTER INGESTION AND >50 ug/mL AT 12 HOURS AFTER INGESTION ARE OFTEN ASSOCIATED WITH TOXIC REACTIONS. Performed at [facility], [address] [place], [place] [address]   Lactic acid, plasma     Status: None   Collection Time: 02/28/18  4:29 PM  Result Value Ref Range   Lactic Acid, Venous 1.9 0.5 - 1.9 mmol/L    Comment: Performed at [facility], [address] [place], [place] [address]  Blood gas, venous     Status: Abnormal   Collection Time: 02/28/18  4:29 PM  Result Value Ref Range   pH, Ven 7.40 7.250 - 7.430   pCO2, Ven 34 (L) 44.0 - 60.0 mmHg   pO2, Ven 59.0 (H) 32.0 - 45.0 mmHg   Bicarbonate 21.1 20.0 - 28.0 mmol/L   Acid-base deficit 3.1 (H) 0.0 - 2.0 mmol/L   O2 Saturation 90.2 %   Patient temperature 37.0    Collection site VEIN    Sample type VENOUS     Comment: Performed at [facility], [address] [place], [place] [address]  POC urine preg, ED     Status: None   Collection Time: 02/28/18  4:37 PM  Result Value Ref Range   Preg Test, Ur Negative Negative  Comprehensive metabolic panel     Status: Abnormal   Collection Time: 02/28/18  6:11 PM  Result Value Ref Range   Sodium 136 135 - 145 mmol/L   Potassium 3.0 (L) 3.5 - 5.1 mmol/L   Chloride 105 101 - 111 mmol/L   CO2 19 (L) 22 - 32 mmol/L   Glucose, Bld 196 (H) 65 - 99 mg/dL   BUN 10 6 - 20 mg/dL   Creatinine, Ser 0.37 (L) 0.44 - 1.00 mg/dL   Calcium 7.9 (L) 8.9 - 10.3 mg/dL   Total Protein 7.8 6.5 - 8.1  g/dL   Albumin 4.3 3.5 - 5.0 g/dL   AST 20 15 - 41 U/L   ALT 15 14 - 54 U/L   Alkaline Phosphatase 72 38 - 126 U/L   Total Bilirubin 0.5 0.3 - 1.2 mg/dL   GFR calc non Af Amer >60 >60 mL/min   GFR calc Af Amer >60 >60 mL/min    Comment: (NOTE) The eGFR has been calculated using the CKD EPI equation. This calculation has not been validated in all clinical situations. eGFR's persistently <60 mL/min signify possible Chronic Kidney Disease.    Anion gap 12 5 - 15    Comment: Performed at [facility], [address], [place], [place] [address]  Salicylate  level     Status: None   Collection Time: 02/28/18  6:11 PM  Result Value Ref Range   Salicylate Lvl 28.7 2.8 - 30.0 mg/dL    Comment: Performed at [facility], [address] [place], [place] [address]  Protime-INR     Status: Abnormal   Collection Time: 02/28/18  6:11 PM  Result Value Ref Range   Prothrombin Time 15.5 (H) 11.4 - 15.2 seconds   INR 1.24     Comment: Performed at [facility], [address] [place], [place] [address]  Glucose, capillary     Status: Abnormal   Collection Time: 02/28/18  9:28 PM  Result Value Ref Range   Glucose-Capillary 163 (H) 65 - 99 mg/dL  MRSA PCR Screening     Status: None   Collection Time: 02/28/18  9:34 PM  Result Value Ref Range   MRSA by PCR NEGATIVE NEGATIVE    Comment:        The GeneXpert MRSA Assay (FDA approved for NASAL specimens only), is one component of a comprehensive MRSA colonization surveillance program. It is not intended to diagnose MRSA infection nor to guide or monitor treatment for MRSA infections. Performed at [facility], [address] [place], [place] [address]   Basic metabolic panel     Status: Abnormal   Collection Time: 02/28/18 11:16 PM  Result Value Ref Range   Sodium 136 135 - 145 mmol/L   Potassium 2.9 (L) 3.5 - 5.1 mmol/L   Chloride 105 101 - 111 mmol/L   CO2 19 (L) 22 - 32 mmol/L   Glucose, Bld  171 (H) 65 - 99 mg/dL   BUN 9 6 - 20 mg/dL   Creatinine, Ser 0.42 (L) 0.44 - 1.00 mg/dL   Calcium 8.4 (L) 8.9 - 10.3 mg/dL   GFR calc non Af Amer >60 >60 mL/min   GFR calc Af Amer >60 >60 mL/min    Comment: (NOTE) The eGFR has been calculated using the CKD EPI equation. This calculation has not been validated in all clinical situations. eGFR's persistently <60 mL/min signify possible Chronic Kidney Disease.    Anion gap 12 5 - 15    Comment: Performed at [facility], [address] [place], [place] [address]  Acetaminophen level     Status: None   Collection Time: 02/28/18 11:16 PM  Result Value Ref Range   Acetaminophen (Tylenol), Serum 11 10 - 30 ug/mL    Comment:        THERAPEUTIC CONCENTRATIONS VARY SIGNIFICANTLY. A RANGE OF 10-30 ug/mL MAY BE AN EFFECTIVE CONCENTRATION FOR MANY PATIENTS. HOWEVER, SOME ARE BEST TREATED AT CONCENTRATIONS OUTSIDE THIS RANGE. ACETAMINOPHEN CONCENTRATIONS >150 ug/mL AT 4 HOURS AFTER INGESTION AND >50 ug/mL AT 12 HOURS AFTER INGESTION ARE OFTEN ASSOCIATED WITH TOXIC REACTIONS. Performed at [facility], [address] [place], [place] [address]   Salicylate level     Status: None   Collection Time: 02/28/18 11:16 PM  Result Value Ref Range   Salicylate Lvl 65.0 2.8 - 30.0 mg/dL    Comment: Performed at [facility], [address] [place], [place] [address]  Comprehensive metabolic panel     Status: Abnormal   Collection Time: 03/01/18  3:33 AM  Result Value Ref Range   Sodium 137 135 - 145 mmol/L   Potassium 3.3 (L) 3.5 - 5.1 mmol/L   Chloride 105 101 - 111 mmol/L   CO2 22 22 - 32 mmol/L  Glucose, Bld 119 (H) 65 - 99 mg/dL   BUN 8 6 - 20 mg/dL   Creatinine, Ser 0.40 (L) 0.44 - 1.00 mg/dL   Calcium 8.6 (L) 8.9 - 10.3 mg/dL   Total Protein 8.5 (H) 6.5 - 8.1 g/dL   Albumin 4.7 3.5 - 5.0 g/dL   AST 17 15 - 41 U/L   ALT 15 14 - 54 U/L   Alkaline Phosphatase 79 38 - 126 U/L   Total Bilirubin 0.5 0.3  - 1.2 mg/dL   GFR calc non Af Amer >60 >60 mL/min   GFR calc Af Amer >60 >60 mL/min    Comment: (NOTE) The eGFR has been calculated using the CKD EPI equation. This calculation has not been validated in all clinical situations. eGFR's persistently <60 mL/min signify possible Chronic Kidney Disease.    Anion gap 10 5 - 15    Comment: Performed at [facility], [address] [place], [place] [address]  Protime-INR     Status: None   Collection Time: 03/01/18  3:33 AM  Result Value Ref Range   Prothrombin Time 12.9 11.4 - 15.2 seconds   INR 0.98     Comment: Performed at [facility], [address] [place], [place] [address]  CBC     Status: Abnormal   Collection Time: 03/01/18  3:33 AM  Result Value Ref Range   WBC 15.8 (H) 3.6 - 11.0 K/uL   RBC 4.07 3.80 - 5.20 MIL/uL   Hemoglobin 12.1 12.0 - 16.0 g/dL   HCT 36.2 35.0 - 47.0 %   MCV 88.8 80.0 - 100.0 fL   MCH 29.8 26.0 - 34.0 pg   MCHC 33.5 32.0 - 36.0 g/dL   RDW 13.8 11.5 - 14.5 %   Platelets 331 150 - 440 K/uL    Comment: Performed at [facility], [address] [place], [place] [address]  Salicylate level     Status: None   Collection Time: 03/01/18  3:33 AM  Result Value Ref Range   Salicylate Lvl 41.3 2.8 - 30.0 mg/dL    Comment: Performed at [facility], [address] [place], [place] [address]  Acetaminophen level     Status: Abnormal   Collection Time: 03/01/18  3:33 AM  Result Value Ref Range   Acetaminophen (Tylenol), Serum <10 (L) 10 - 30 ug/mL    Comment:        THERAPEUTIC CONCENTRATIONS VARY SIGNIFICANTLY. A RANGE OF 10-30 ug/mL MAY BE AN EFFECTIVE CONCENTRATION FOR MANY PATIENTS. HOWEVER, SOME ARE BEST TREATED AT CONCENTRATIONS OUTSIDE THIS RANGE. ACETAMINOPHEN CONCENTRATIONS >150 ug/mL AT 4 HOURS AFTER INGESTION AND >50 ug/mL AT 12 HOURS AFTER INGESTION ARE OFTEN ASSOCIATED WITH TOXIC REACTIONS. Performed at [facility], [address] [place], [place] [address]   AST     Status: None   Collection Time: 03/01/18  3:02 PM  Result Value Ref Range   AST 16 15 - 41 U/L    Comment: Performed at [facility], [address] [place], [place] [address]  ALT     Status: None   Collection Time: 03/01/18  3:02 PM  Result Value Ref Range   ALT 14 14 - 54 U/L    Comment: Performed at [facility], [address] [place],  [address]  Acetaminophen level     Status: Abnormal   Collection Time: 03/01/18  3:02 PM  Result Value Ref Range   Acetaminophen (Tylenol), Serum <10 (  L) 10 - 30 ug/mL    Comment:        THERAPEUTIC CONCENTRATIONS VARY SIGNIFICANTLY. A RANGE OF 10-30 ug/mL MAY BE AN EFFECTIVE CONCENTRATION FOR MANY PATIENTS. HOWEVER, SOME ARE BEST TREATED AT CONCENTRATIONS OUTSIDE THIS RANGE. ACETAMINOPHEN CONCENTRATIONS >150 ug/mL AT 4 HOURS AFTER INGESTION AND >50 ug/mL AT 12 HOURS AFTER INGESTION ARE OFTEN ASSOCIATED WITH TOXIC REACTIONS. Performed at [facility], [address], [place], [place] [address]     Current Facility-Administered Medications  Medication Dose Route Frequency Provider Last Rate Last Dose  . docusate sodium (COLACE) capsule 100 mg  100 mg Oral BID PRN Vaughan Basta, MD      . ondansetron [facility]) injection 4 mg  4 mg Intravenous Q6H PRN Vaughan Basta, MD   4 mg at 02/28/18 2310    Musculoskeletal: Strength & Muscle Tone: within normal limits Gait & Station: normal Patient leans: N/A  Psychiatric Specialty Exam: Physical Exam  Nursing note and vitals reviewed. Constitutional: She appears well-developed and well-nourished.  HENT:  Head: Normocephalic and atraumatic.  Eyes: Pupils are equal, round, and reactive to light. Conjunctivae are normal.  Neck: Normal range of motion.  Cardiovascular: Regular rhythm and normal heart sounds.  Respiratory: Effort normal. No respiratory distress.  GI: Soft.  Musculoskeletal: Normal  range of motion.  Neurological: She is alert.  Skin: Skin is warm and dry.  Psychiatric: She has a normal mood and affect. Her speech is normal and behavior is normal. Thought content normal. She is not agitated. Cognition and memory are normal. She expresses impulsivity.    Review of Systems  Constitutional: Negative.   HENT: Negative.   Eyes: Negative.   Respiratory: Negative.   Cardiovascular: Negative.   Gastrointestinal: Negative.   Musculoskeletal: Negative.   Skin: Negative.   Neurological: Negative.   Psychiatric/Behavioral: Negative for depression, hallucinations, memory loss, substance abuse and suicidal ideas. The patient is not nervous/anxious and does not have insomnia.     Blood pressure 104/65, pulse 71, temperature 98.4 F (36.9 C), temperature source Oral, resp. rate 18, height _0  (1.549 m), weight 62 kg (136 lb 9.6 oz), last menstrual period 02/28/2018, SpO2 99 %, unknown if currently breastfeeding.Body mass index is 25.81 kg/m.  General Appearance: Casual  Eye Contact:  Good  Speech:  Clear and Coherent  Volume:  Normal  Mood:  Euthymic  Affect:  Constricted  Thought Process:  Goal Directed  Orientation:  Full (Time, Place, and Person)  Thought Content:  Logical  Suicidal Thoughts:  No  Homicidal Thoughts:  No  Memory:  Immediate;   Fair Recent;   Fair Remote;   Fair  Judgement:  Fair  Insight:  Fair  Psychomotor Activity:  Normal  Concentration:  Concentration: Fair  Recall:  [employer] of Knowledge:  Fair  Language:  Fair  Akathisia:  No  Handed:  Right  AIMS (if indicated):     Assets:  Communication Skills Desire for Improvement Housing Physical Health Resilience Social Support  ADL's:  Intact  Cognition:  WNL  Sleep:        Treatment Plan Summary: Plan 22 year old woman had an acute impulsive overdose.  Does not report symptoms consistent with major depression or chronic mental health problems.  Possible past history of impulsivity.   No evidence of psychosis.  Not intoxicated.  Patient is requesting discharge home.  He shows reasonably good insight.  Has been cooperative with treatment here in the hospital.  I do not think she  meets commitment criteria at this point.  Patient counseled about the importance of getting outpatient mental health treatment.  He will be given a referral to local mental health agencies.  No start of any new medications at this point.  Case reviewed with hospitalist and nursing.  Discontinue IVC.  Patient understands plan.  Disposition: No evidence of imminent risk to self or others at present.   Patient does not meet criteria for psychiatric inpatient admission. Supportive therapy provided about ongoing stressors. Discussed crisis plan, support from social network, calling 911, coming to the Emergency Department, and calling Suicide Hotline.  Alethia Berthold, MD 03/01/2018 5:04 PM

## 2018-03-02 LAB — HIV ANTIBODY (ROUTINE TESTING W REFLEX): HIV SCREEN 4TH GENERATION: NONREACTIVE

## 2018-03-02 NOTE — Discharge Summary (Signed)
[employer] [facility] [place] [facility]  Sabrina Pace, [place]22 y.o., DOB 04/16/1996, MRN [id number]. 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 female with a known history of anemia, tried to commit suicide by taking 6576 Excedrin migraine tablets ( salicylate and acetaminophen) . She was brought to emergency room within one hour so ER physician gave activated charcoal to her which is causing her nausea and vomiting since then. Also has some complaint of epigastric abdominal pain and tenderness.  Patient was admitted and started on acetylcysteine IV however her Tylenol levels remain low therefore this was discontinued.  Patient was seen by psychiatry and they recommended no further inpatient psychiatric care.  By the time they saw her they did not feel that she was suicidal.  Recommended outpatient follow-up.             Consults psychiatry  Significant Tests:  See full reports for all details    No results found.     Today   Subjective:   Sabrina Pace patient doing much better denies any complaints Objective:   Blood pressure 104/65, pulse 71, temperature 98.4 F (36.9 C), temperature source Oral, resp. rate 18, height 5\' 1"  (1.549 m), weight 62 kg (136 lb 9.6 oz), last menstrual period 02/28/2018, SpO2 99 %, unknown if currently breastfeeding.  . No intake or output data in the 24 hours ending 03/02/18 1839  Exam VITAL  SIGNS: Blood pressure 104/65, pulse 71, temperature 98.4 F (36.9 C), temperature source Oral, resp. rate 18, height 5\' 1"  (1.549 m), weight 62 kg (136 lb 9.6 oz), last menstrual period 02/28/2018, SpO2 99 %, unknown if currently breastfeeding.  GENERAL:  22 y.o.-year-old patient lying in the bed with no acute distress.  EYES: Pupils equal, round, reactive to light and accommodation. No scleral icterus. Extraocular muscles intact.  HEENT: Head atraumatic, normocephalic. Oropharynx and nasopharynx clear.  NECK:  Supple, no jugular venous distention. No thyroid enlargement, no tenderness.  LUNGS: Normal breath sounds bilaterally, no wheezing, rales,rhonchi or crepitation. No use of accessory muscles of respiration.  CARDIOVASCULAR: S1, S2 normal. No murmurs, rubs, or gallops.  ABDOMEN: Soft, nontender, nondistended. Bowel sounds present. No organomegaly or mass.  EXTREMITIES: No pedal edema, cyanosis, or clubbing.  NEUROLOGIC: Cranial nerves II through XII are intact. Muscle strength 5/5 in all extremities. Sensation intact. Gait not checked.  PSYCHIATRIC: The patient is alert and oriented x 3.  SKIN: No obvious rash, lesion, or ulcer.   Data Review     CBC w Diff:  Lab Results  Component Value Date   WBC 15.8 (H) 03/01/2018   HGB 12.1 03/01/2018   HCT 36.2 03/01/2018   PLT 331 03/01/2018   LYMPHOPCT 17 12/17/2016   MONOPCT 7 12/17/2016   EOSPCT 1 12/17/2016   BASOPCT 0 12/17/2016   CMP:  Lab Results  Component Value Date   NA 137 03/01/2018   K 3.3 (L) 03/01/2018   CL 105 03/01/2018   CO2 22 03/01/2018   BUN 8 03/01/2018   CREATININE  0.40 (L) 03/01/2018   PROT 8.5 (H) 03/01/2018   ALBUMIN 4.7 03/01/2018   BILITOT 0.5 03/01/2018   ALKPHOS 79 03/01/2018   AST 16 03/01/2018   ALT 14 03/01/2018  .  Micro Results Recent Results (from the past 240 hour(s))  MRSA PCR Screening     Status: None   Collection Time: 02/28/18  9:34 PM  Result Value Ref Range Status   MRSA by  PCR NEGATIVE NEGATIVE Final    Comment:        The GeneXpert MRSA Assay (FDA approved for NASAL specimens only), is one component of a comprehensive MRSA colonization surveillance program. It is not intended to diagnose MRSA infection nor to guide or monitor treatment for MRSA infections. Performed at [facility], [address] [place], [place] [address]      Code Status History    Date Active Date Inactive Code Status Order ID Comments User Context   02/28/2018 2122 03/01/2018 2150 Full Code [id number]  Altamese Dilling, MD Inpatient   05/22/2017 0251 05/23/2017 1348 Full Code [id number]  Ward, Elenora Fender, MD Inpatient   05/21/2017 1142 05/22/2017 0251 Full Code [id number]  Ward, Elenora Fender, MD Inpatient          Follow-up Information    Department, [facility] Follow up in 1 week(s).   Why:  hosp f/u Contact information: [address] [place] [address] [place] [address] [phone]           Discharge Medications   Allergies as of 03/01/2018   No Known Allergies     Medication List    STOP taking these medications   ibuprofen 600 MG tablet Commonly known as:  ADVIL,MOTRIN          Total Time in preparing paper work, data evaluation and todays exam - 35 minutes  Auburn Bilberry M.D on 03/02/2018 at 6:39 PM [employer]   Office  [phone]

## 2018-08-07 ENCOUNTER — Other Ambulatory Visit: Payer: Self-pay | Admitting: Advanced Practice Midwife

## 2018-08-07 DIAGNOSIS — Z3482 Encounter for supervision of other normal pregnancy, second trimester: Secondary | ICD-10-CM

## 2018-08-08 LAB — OB RESULTS CONSOLE HIV ANTIBODY (ROUTINE TESTING): HIV: NONREACTIVE

## 2018-08-08 LAB — OB RESULTS CONSOLE RPR: RPR: NONREACTIVE

## 2018-08-08 LAB — OB RESULTS CONSOLE RUBELLA ANTIBODY, IGM: RUBELLA: IMMUNE

## 2018-08-08 LAB — OB RESULTS CONSOLE VARICELLA ZOSTER ANTIBODY, IGG: Varicella: IMMUNE

## 2018-08-08 LAB — OB RESULTS CONSOLE HEPATITIS B SURFACE ANTIGEN: Hepatitis B Surface Ag: NEGATIVE

## 2018-08-11 ENCOUNTER — Ambulatory Visit
Admission: RE | Admit: 2018-08-11 | Discharge: 2018-08-11 | Disposition: A | Discharge status: Home / Self Care | Payer: Self-pay | Source: Ambulatory Visit | Attending: Advanced Practice Midwife | Admitting: Advanced Practice Midwife

## 2018-08-11 DIAGNOSIS — Z3482 Encounter for supervision of other normal pregnancy, second trimester: Secondary | ICD-10-CM | POA: Insufficient documentation

## 2018-08-21 ENCOUNTER — Ambulatory Visit: Admission: RE | Admit: 2018-08-21 | Payer: Self-pay | Source: Ambulatory Visit

## 2018-08-24 ENCOUNTER — Other Ambulatory Visit: Payer: Self-pay

## 2018-08-24 DIAGNOSIS — Z9289 Personal history of other medical treatment: Secondary | ICD-10-CM

## 2018-08-28 ENCOUNTER — Other Ambulatory Visit: Payer: Self-pay

## 2018-08-28 ENCOUNTER — Inpatient Hospital Stay: Admission: RE | Admit: 2018-08-28 | Payer: Self-pay | Source: Ambulatory Visit

## 2018-08-28 ENCOUNTER — Encounter: Payer: Self-pay | Admitting: Genetics

## 2018-08-28 ENCOUNTER — Ambulatory Visit
Admission: RE | Admit: 2018-08-28 | Discharge: 2018-08-28 | Disposition: A | Discharge status: Home / Self Care | Payer: Self-pay | Source: Ambulatory Visit | Attending: Obstetrics and Gynecology | Admitting: Obstetrics and Gynecology

## 2018-08-28 ENCOUNTER — Other Ambulatory Visit
Admission: RE | Admit: 2018-08-28 | Discharge: 2018-08-28 | Disposition: A | Discharge status: Home / Self Care | Payer: Self-pay | Source: Ambulatory Visit | Attending: Obstetrics and Gynecology | Admitting: Obstetrics and Gynecology

## 2018-08-28 DIAGNOSIS — Z3A22 22 weeks gestation of pregnancy: Secondary | ICD-10-CM | POA: Insufficient documentation

## 2018-08-28 DIAGNOSIS — Z9289 Personal history of other medical treatment: Secondary | ICD-10-CM

## 2018-08-28 DIAGNOSIS — O289 Unspecified abnormal findings on antenatal screening of mother: Secondary | ICD-10-CM

## 2018-08-28 DIAGNOSIS — R718 Other abnormality of red blood cells: Secondary | ICD-10-CM

## 2018-08-28 DIAGNOSIS — O361920 Maternal care for other isoimmunization, second trimester, not applicable or unspecified: Secondary | ICD-10-CM

## 2018-08-28 NOTE — Progress Notes (Signed)
Coordinated testing for amniotic fluid sample to [facility] for: Amniotic Fluid Chromosome Analysis Amniotic Fluid AFP analysis Send out from [facility] to [employer] for Prenatal Kell Genotyping  Maternal cell contamination sample also collected to be sent from [facility] to [employer]  Will follow up with patient when results become available.

## 2018-08-29 LAB — MISC LABCORP TEST (SEND OUT)

## 2018-09-05 LAB — CHROMOSOME, AMNIOTIC FLUID

## 2018-09-07 ENCOUNTER — Other Ambulatory Visit: Payer: Self-pay

## 2018-09-07 DIAGNOSIS — O361920 Maternal care for other isoimmunization, second trimester, not applicable or unspecified: Secondary | ICD-10-CM

## 2018-09-11 ENCOUNTER — Inpatient Hospital Stay: Admission: RE | Admit: 2018-09-11 | Payer: Self-pay | Source: Ambulatory Visit

## 2018-09-14 ENCOUNTER — Other Ambulatory Visit: Payer: Self-pay

## 2018-09-14 DIAGNOSIS — O361920 Maternal care for other isoimmunization, second trimester, not applicable or unspecified: Secondary | ICD-10-CM

## 2018-09-18 ENCOUNTER — Ambulatory Visit
Admission: RE | Admit: 2018-09-18 | Discharge: 2018-09-18 | Disposition: A | Discharge status: Home / Self Care | Payer: Self-pay | Source: Ambulatory Visit | Attending: Obstetrics & Gynecology | Admitting: Obstetrics & Gynecology

## 2018-09-18 DIAGNOSIS — O361920 Maternal care for other isoimmunization, second trimester, not applicable or unspecified: Secondary | ICD-10-CM | POA: Insufficient documentation

## 2018-09-18 DIAGNOSIS — Z3A25 25 weeks gestation of pregnancy: Secondary | ICD-10-CM | POA: Insufficient documentation

## 2018-09-23 LAB — MISC LABCORP TEST (SEND OUT)

## 2018-09-27 ENCOUNTER — Emergency Department
Admission: EM | Admit: 2018-09-27 | Discharge: 2018-09-27 | Disposition: A | Discharge status: Home / Self Care | Payer: No Typology Code available for payment source | Attending: Student in an Organized Health Care Education/Training Program | Admitting: Student in an Organized Health Care Education/Training Program

## 2018-09-27 ENCOUNTER — Observation Stay
Admission: EM | Admit: 2018-09-27 | Discharge: 2018-09-27 | Disposition: A | Discharge status: Home / Self Care | Payer: Self-pay | Attending: Obstetrics and Gynecology | Admitting: Obstetrics and Gynecology

## 2018-09-27 ENCOUNTER — Emergency Department: Payer: No Typology Code available for payment source

## 2018-09-27 ENCOUNTER — Encounter: Payer: Self-pay | Admitting: Emergency Medicine

## 2018-09-27 ENCOUNTER — Other Ambulatory Visit: Payer: Self-pay

## 2018-09-27 DIAGNOSIS — Z3A00 Weeks of gestation of pregnancy not specified: Secondary | ICD-10-CM | POA: Insufficient documentation

## 2018-09-27 DIAGNOSIS — Z3A25 25 weeks gestation of pregnancy: Secondary | ICD-10-CM | POA: Insufficient documentation

## 2018-09-27 DIAGNOSIS — Z79899 Other long term (current) drug therapy: Secondary | ICD-10-CM | POA: Diagnosis not present

## 2018-09-27 DIAGNOSIS — Z3A28 28 weeks gestation of pregnancy: Secondary | ICD-10-CM | POA: Diagnosis not present

## 2018-09-27 DIAGNOSIS — M79631 Pain in right forearm: Secondary | ICD-10-CM | POA: Insufficient documentation

## 2018-09-27 DIAGNOSIS — Z041 Encounter for examination and observation following transport accident: Principal | ICD-10-CM | POA: Insufficient documentation

## 2018-09-27 DIAGNOSIS — O26892 Other specified pregnancy related conditions, second trimester: Secondary | ICD-10-CM | POA: Diagnosis present

## 2018-09-27 DIAGNOSIS — M79642 Pain in left hand: Secondary | ICD-10-CM | POA: Diagnosis not present

## 2018-09-27 MED ORDER — LACTATED RINGERS IV SOLN: 500.0000 mL | INTRAVENOUS | Status: DC | PRN

## 2018-09-27 MED ORDER — ACETAMINOPHEN 500 MG PO TABS
1000.0000 mg | ORAL_TABLET | Freq: Once | ORAL | Status: AC
  Administered 2018-09-27: 1000 mg via ORAL
  Filled 2018-09-27: qty 2

## 2018-09-27 MED ORDER — OXYTOCIN BOLUS FROM INFUSION: 500.0000 mL | Freq: Once | INTRAVENOUS | Status: DC

## 2018-09-27 MED ORDER — OXYCODONE-ACETAMINOPHEN 5-325 MG PO TABS: 1.0000 | ORAL_TABLET | ORAL | Status: DC | PRN

## 2018-09-27 MED ORDER — ACETAMINOPHEN 325 MG PO TABS: 650.0000 mg | ORAL_TABLET | ORAL | Status: DC | PRN

## 2018-09-27 MED ORDER — OXYTOCIN 40 UNITS IN LACTATED RINGERS INFUSION - SIMPLE MED: 2.5000 [IU]/h | INTRAVENOUS | Status: DC

## 2018-09-27 MED ORDER — LACTATED RINGERS IV SOLN: INTRAVENOUS | Status: DC
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
## 2018-09-27 NOTE — ED Notes (Signed)
Pt discharged to L&D OBS 4 after verbalizing understanding of discharge instructions; nad noted.

## 2018-09-27 NOTE — Discharge Instructions (Signed)
Información sobre parto y trabajo de parto prematuros °(Preterm Labor and Birth Information) °La duración de un embarazo normal es de 39 a 41 semanas. Se llama trabajo de parto prematuro cuando se inicia antes de las 37 semanas de embarazo. °¿CUÁLES SON LOS FACTORES DE RIESGO DEL TRABAJO DE PARTO PREMATURO? °Existen mayores probabilidades de trabajo de parto prematuro en mujeres con las siguientes características: °· Tienen ciertas infecciones durante el embarazo, como infección de vejiga, infección de transmisión sexual o infección en el útero (corioamnionitis). °· Tienen el cuello del útero más corto que lo normal. °· Tuvieron trabajo de parto prematuro anteriormente. °· Se sometieron a una cirugía en el cuello del útero. °· Son menores de 17 años o mayores de 35 años de edad. °· Son afroamericanas. °· Están embarazadas de mellizos o de varios bebés (gestación múltiple). °· Consumen drogas o fuman mientras están embarazadas. °· No aumentan de peso lo suficiente durante el embarazo. °· Se embarazan poco después de haber estado embarazadas. °¿CUÁLES SON LOS SÍNTOMAS DEL TRABAJO DE PARTO PREMATURO? °Los síntomas del trabajo de parto prematuro incluyen lo siguiente: °· Calambres similares a los que ocurren durante el período menstrual. Los calambres pueden presentarse con diarrea. °· Dolor en el abdomen o en la parte inferior de la espalda. °· Contracciones uterinas regulares que se pueden sentir como una presión en el abdomen. °· Una sensación de mayor presión en la pelvis. °· Aumento de la secreción de moco acuoso o sanguinolento en la vagina. °· Rotura de bolsa (rotura de saco amniótico). °¿POR QUÉ ES IMPORTANTE RECONOCER LOS SIGNOS DEL TRABAJO DE PARTO PREMATURO? °Es importante reconocer los signos del trabajo de parto prematuro porque los bebés que nacen de forma prematura pueden no estar completamente desarrollados. Por lo tanto, pueden correr mayor riesgo de lo siguiente: °· Problemas cardíacos y pulmonares a  largo plazo (crónicos). °· Inmediatamente después del parto, dificultades para regular los sistemas corporales, que incluyen glucemia, temperatura corporal, frecuencia cardíaca y frecuencia respiratoria. °· Hemorragia cerebral. °· Parálisis cerebral. °· Dificultades en el aprendizaje. °· Muerte. °Estos riesgos son mucho mayores para bebés que nacen antes de las 34 semanas de embarazo. °¿CÓMO SE TRATA EL TRABAJO DE PARTO PREMATURO? °El tratamiento depende del tiempo de su embarazo, su afección y la salud de su bebé. Puede incluir lo siguiente: °· Tener un punto (sutura) en el cuello del útero para evitar que este se abra demasiado pronto (cerclaje). °· Tomar medicamentos, por ejemplo: °? Medicamentos hormonales. Estos se pueden administrar de forma temprana en el embarazo para ayudar a mantener el embarazo. °? Medicamentos para detener las contracciones. °? Medicamentos que ayudan a madurar los pulmones del bebé. Estos se pueden recetar si el riesgo de parto es alto. °? Medicamentos para evitar que el bebé desarrolle parálisis cerebral. °Si el trabajo de parto de inicia antes de las 34 semanas de embarazo, es posible que deba hospitalizarse. °¿QUÉ DEBO HACER SI CREO QUE ESTOY EN TRABAJO DE PARTO PREMATURO? °Si cree que está iniciando trabajo de parto prematuro, llame al médico de inmediato. °¿CÓMO PUEDO EVITAR EL TRABAJO DE PARTO PREMATURO EN FUTUROS EMBARAZOS? °Para aumentar las probabilidades de tener un embarazo a término, tenga en cuenta lo siguiente: °· No consuma ningún producto que contenga tabaco, lo que incluye cigarrillos, tabaco de mascar y cigarrillos electrónicos. Si necesita ayuda para dejar de fumar, consulte al médico. °· No consuma drogas ni medicamentos que no sean recetados durante el embarazo. °· Hable con el médico antes de tomar suplementos a base de hierbas aunque los haya estado tomando   periódicamente. °· Asegúrese de llegar a un peso saludable durante el embarazo. °· Tenga cuidado con las  infecciones. Si cree que puede tener una infección, consulte al médico para que la revisen. °· Asegúrese de informarle al médico si ha tenido trabajo de parto prematuro antes. °Esta información no tiene como fin reemplazar el consejo del médico. Asegúrese de hacerle al médico cualquier pregunta que tenga. °Document Released: 02/15/2008 Document Revised: 07/11/2013 Document Reviewed: 03/31/2016 °Elsevier Interactive Patient Education © 2018 Else[employer] Inc[employer] ° °
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
## 2018-09-27 NOTE — ED Notes (Signed)
FHT 144 BPM detected via doppler
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
## 2018-09-27 NOTE — ED Notes (Signed)
Pt is 7 months pregnant

## 2018-10-05 LAB — CHROMOSOME AFP/ACHE/HBF, AMNIOTIC FLUID
052138: 20
AFP Value, Am: 6.1 ug/mL
Acetylcholinesterase, Am: NEGATIVE
CELLS ANALYZED: 20
Cells Karyotyped: 2
Colonies: 8
GESTATIONAL AGE(WKS): 22
GTG Band Resolution Achieved: 450
MOM, Amniotic Fluid: 1.4

## 2018-10-10 LAB — HM HIV SCREENING LAB: HM HIV Screening: NEGATIVE

## 2018-11-22 NOTE — L&D Delivery Note (Signed)
Delivery Note  Date of delivery: 01/02/2019 Estimated Date of Delivery: 12/31/18 Patient's last menstrual period was 04/02/2018. EGA: [redacted]w[redacted]d  First Stage: Induction: misoprostol Augmentation : AROM  Analgesia Sabrina Pace intrapartum: IV Stadol AROM at 1935 bloody fluid  Sabrina Pace presented to L&D with decreased fetal movement at term. She was induced with misoprostol and augmented with AROM. IV Stadol given for pain relief. Epidural placed shortly before delivery, but without any pain relief.    Second Stage: Complete dilation at 2129 Onset of pushing at 2129 FHR second stage: category II Delivery at 2133 on 01/02/2019  She progressed to complete and had a spontaneous vaginal birth of a live female over an intact perineum. The fetal head was delivered in direct OA position with restitution to ROA. No nuchal cord. Anterior then posterior shoulders delivered with minimal assistance. Baby placed on mom's abdomen and attended to by transition RN. Cord clamped and cut after two minute delay by patient.  Third Stage: Placenta delivered spontaneously intact with 3VC at 2138. Marginal cord insertion.  Placenta disposition: routine disposal  Uterine tone firm / bleeding scant IV pitocin given for hemorrhage prophylaxis  No laceration identified  Anesthesia for repair: n/a Repair n/a Est. Blood Loss (mL): 500  Complications: none  Mom to postpartum.  Baby to Couplet care / Skin to Skin.  Newborn: Birth Weight: pending  Apgar Scores: 9, 9 Feeding planned: breast   Genia Del, CNM 01/02/2019 10:01 PM

## 2018-12-01 ENCOUNTER — Observation Stay
Admission: EM | Admit: 2018-12-01 | Discharge: 2018-12-01 | Disposition: A | Discharge status: Home / Self Care | Payer: Self-pay | Attending: Certified Nurse Midwife | Admitting: Certified Nurse Midwife

## 2018-12-01 ENCOUNTER — Other Ambulatory Visit: Payer: Self-pay

## 2018-12-01 DIAGNOSIS — Z3A35 35 weeks gestation of pregnancy: Secondary | ICD-10-CM | POA: Insufficient documentation

## 2018-12-01 DIAGNOSIS — O479 False labor, unspecified: Secondary | ICD-10-CM | POA: Diagnosis present

## 2018-12-01 DIAGNOSIS — Z79899 Other long term (current) drug therapy: Secondary | ICD-10-CM | POA: Insufficient documentation

## 2018-12-01 LAB — URINALYSIS, COMPLETE (UACMP) WITH MICROSCOPIC
Bilirubin Urine: NEGATIVE
GLUCOSE, UA: NEGATIVE mg/dL
Hgb urine dipstick: NEGATIVE
KETONES UR: NEGATIVE mg/dL
NITRITE: NEGATIVE
Protein, ur: NEGATIVE mg/dL
Specific Gravity, Urine: 1.02 (ref 1.005–1.030)
pH: 6 (ref 5.0–8.0)

## 2018-12-01 MED ORDER — AMOXICILLIN-POT CLAVULANATE 875-125 MG PO TABS: 1.0000 | ORAL_TABLET | Freq: Two times a day (BID) | ORAL | 0 refills | Status: DC

## 2018-12-01 MED ORDER — LACTATED RINGERS IV BOLUS
1000.0000 mL | Freq: Once | INTRAVENOUS | Status: AC
  Administered 2018-12-01: 1000 mL via INTRAVENOUS

## 2018-12-01 MED ORDER — CALCIUM CARBONATE ANTACID 500 MG PO CHEW: 2.0000 | CHEWABLE_TABLET | ORAL | Status: DC | PRN

## 2018-12-01 MED ORDER — AMOXICILLIN-POT CLAVULANATE 875-125 MG PO TABS
1.0000 | ORAL_TABLET | Freq: Two times a day (BID) | ORAL | Status: DC
  Administered 2018-12-01: 1 via ORAL
  Filled 2018-12-01: qty 1

## 2018-12-01 MED ORDER — DOCUSATE SODIUM 100 MG PO CAPS
100.0000 mg | ORAL_CAPSULE | Freq: Every day | ORAL | Status: DC
  Administered 2018-12-01: 100 mg via ORAL
  Filled 2018-12-01: qty 1

## 2018-12-01 MED ORDER — ACETAMINOPHEN 325 MG PO TABS: 650.0000 mg | ORAL_TABLET | ORAL | Status: DC | PRN

## 2018-12-01 MED ORDER — PRENATAL MULTIVITAMIN CH
1.0000 | ORAL_TABLET | Freq: Every day | ORAL | Status: DC
  Administered 2018-12-01: 1 via ORAL
  Filled 2018-12-01: qty 1

## 2018-12-01 NOTE — Progress Notes (Signed)
   Sabrina Pace is a 23 y.o. female. She is at 9050w4d gestation. Patient's last menstrual period was 04/02/2018. Estimated Date of Delivery: 01/01/19  Prenatal care site: [facility]   Chief complaint: vaginal pressure, contractions Location: vagina/uterus Onset/timing: today at 0500 Duration: intermittent Quality: pressure Severity: mild to moderate Aggravating or alleviating conditions: none Associated signs/symptoms: none Context: Sabrina Pace reports a vaginal pressure that started around 0500 this morning. The pressure comes and goes every few minutes.   S: Resting comfortably.   She reports:  -active fetal movement -no leakage of fluid  -no vaginal bleeding  Maternal Medical History:   Past Medical History:  Diagnosis Date  . Anemia   . Medical history non-contributory     Past Surgical History:  Procedure Laterality Date  . APPENDECTOMY      No Known Allergies  Prior to Admission medications   Medication Sig Start Date End Date Taking? Authorizing Provider  ferrous sulfate 325 (65 FE) MG EC tablet Take 325 mg by mouth daily with breakfast.    [provider]  Prenatal Vit-Fe Fumarate-FA (MULTIVITAMIN-PRENATAL) 27-0.8 MG TABS tablet Take 1 tablet by mouth daily at 12 noon.    [provider]     Social History: She  reports that she has never smoked. She has never used smokeless tobacco. She reports that she does not drink alcohol or use drugs.  Family History: family history includes Diabetes in her maternal grandmother and mother; Hypertension in her maternal grandmother.   Review of Systems: A full review of systems was performed and negative except as noted in the HPI.     O:  BP (!) 100/55   Pulse 90   Temp (!) 97.5 F (36.4 C) (Oral)   LMP 04/02/2018  No results found for this or any previous visit (from the past 48 hour(s)).   Constitutional: NAD, AAOx3  HE/ENT: extraocular movements grossly intact, moist mucous  membranes CV: RRR PULM: normal respiratory effort, CTABL     Abd: gravid, non-tender, non-distended, soft      Ext: Non-tender, Nonedmeatous   Psych: mood appropriate, speech normal Pelvic: 1cm/40-50%/-2 posterior per RN Sabrina Pace at [phone]  NST:  Baseline: 130bpm Variability: moderate Accelerations: 15x15 present x >2 Decelerations: absent Time: 20mins Toco: uterine irritability to contractions every 3-7 minutes   A/P: 23 y.o. 4950w4d here for antenatal surveillance during pregnancy.  Principle diagnosis: uterine contractions, vaginal pressure  Contractions  IV hydration with 1L LR over 2 hours. Plan for possible dose of terbutaline if no cessation of contractions with hydration.   Fetal Wellbeing  Category I tracing.  Cervical dilation  Plan to recheck at 4-6 hours from initial check, unless clinically indicated before that.    Sabrina Pace 12/01/2018 8:32 AM  ----- Sabrina Pace, CNM Certified Nurse Midwife [facility], Department of OB/GYN [facility]

## 2018-12-01 NOTE — Discharge Summary (Signed)
Sabrina Pace is a 23 y.o. female. She is at 7869w4d gestation. Patient's last menstrual period was 04/02/2018. Estimated Date of Delivery: 01/01/19  Prenatal care site: [facility]   Chief complaint: vaginal pressure, contractions Location: vagina/uterus Onset/timing: today at 0500 Duration: intermittent Quality: pressure Severity: mild to moderate Aggravating or alleviating conditions: none Associated signs/symptoms: none Context: Sabrina Pace reports a vaginal pressure that started around 0500 this morning. The pressure comes and goes every few minutes.   S: Resting comfortably.   She reports:  -active fetal movement -no leakage of fluid  -no vaginal bleeding  Maternal Medical History:   Past Medical History:  Diagnosis Date  . Anemia   . Medical history non-contributory     Past Surgical History:  Procedure Laterality Date  . APPENDECTOMY      No Known Allergies  Prior to Admission medications   Medication Sig Start Date End Date Taking? Authorizing Provider  ferrous sulfate 325 (65 FE) MG EC tablet Take 325 mg by mouth daily with breakfast.    [provider]  Prenatal Vit-Fe Fumarate-FA (MULTIVITAMIN-PRENATAL) 27-0.8 MG TABS tablet Take 1 tablet by mouth daily at 12 noon.    [provider]     Social History: She  reports that she has never smoked. She has never used smokeless tobacco. She reports that she does not drink alcohol or use drugs.  Family History: family history includes Diabetes in her maternal grandmother and mother; Hypertension in her maternal grandmother.   Review of Systems: A full review of systems was performed and negative except as noted in the HPI.     O:  BP (!) 95/45 (BP Location: Left Arm)   Pulse 82   Temp 97.8 F (36.6 C) (Oral)   Resp 19   LMP 04/02/2018  Results for orders placed or performed during the hospital encounter of 12/01/18 (from the past 48 hour(s))  Urinalysis, Complete w Microscopic   Collection Time: 12/01/18  8:41 AM  Result Value Ref Range   Color, Urine YELLOW (A) YELLOW   APPearance HAZY (A) CLEAR   Specific Gravity, Urine 1.020 1.005 - 1.030   pH 6.0 5.0 - 8.0   Glucose, UA NEGATIVE NEGATIVE mg/dL   Hgb urine dipstick NEGATIVE NEGATIVE   Bilirubin Urine NEGATIVE NEGATIVE   Ketones, ur NEGATIVE NEGATIVE mg/dL   Protein, ur NEGATIVE NEGATIVE mg/dL   Nitrite NEGATIVE NEGATIVE   Leukocytes, UA LARGE (A) NEGATIVE   RBC / HPF 0-5 0 - 5 RBC/hpf   WBC, UA 6-10 0 - 5 WBC/hpf   Bacteria, UA RARE (A) NONE SEEN   Squamous Epithelial / LPF 11-20 0 - 5   Mucus PRESENT      Constitutional: NAD, AAOx3  HE/ENT: extraocular movements grossly intact, moist mucous membranes CV: RRR PULM: normal respiratory effort, CTABL     Abd: gravid, non-tender, non-distended, soft      Ext: Non-tender, Nonedmeatous   Psych: mood appropriate, speech normal Pelvic: 1cm/40-50%/-2 posterior per RN [name] at [phone], no change by same RN with exam at 1059  NST/monitoring:  Baseline: 125bpm Variability: moderate Accelerations: 15x15 present x >2 Decelerations: absent Time: 5 hours Toco: uterine irritability to rare contractions   A/P: 23 y.o. 6369w4d here for antenatal surveillance during pregnancy.  Principle diagnosis: uterine contractions, resolved  Labor  Not present  No cervical change over 4 hours  Rare contarctions  Fetal Wellbeing  Category I tracing, reassuring for GA  D/c home stable, precautions reviewed, follow-up  as scheduled.    Genia DelMargaret Eithel Ryall 12/01/2018 12:09 PM  ----- Genia DelMargaret Kennette Cuthrell, CNM Certified Nurse Midwife [facility], Department of OB/GYN [facility]

## 2018-12-02 LAB — URINE CULTURE: Culture: <10000 — AB

## 2018-12-28 ENCOUNTER — Inpatient Hospital Stay: Payer: MEDICAID | Admitting: Oncology

## 2018-12-28 ENCOUNTER — Telehealth: Payer: Self-pay

## 2018-12-28 NOTE — Telephone Encounter (Signed)
Called patient several times and she would not answer. I was not able to leave her a voicemail either.

## 2019-01-02 ENCOUNTER — Inpatient Hospital Stay: Payer: Medicaid Other | Admitting: Anesthesiology

## 2019-01-02 ENCOUNTER — Other Ambulatory Visit: Payer: Self-pay | Admitting: Obstetrics and Gynecology

## 2019-01-02 ENCOUNTER — Other Ambulatory Visit: Payer: Self-pay

## 2019-01-02 ENCOUNTER — Inpatient Hospital Stay
Admission: EM | Admit: 2019-01-02 | Discharge: 2019-01-04 | DRG: 806 | Disposition: A | Discharge status: Home / Self Care | Payer: Medicaid Other | Attending: Certified Nurse Midwife | Admitting: Certified Nurse Midwife

## 2019-01-02 DIAGNOSIS — O368130 Decreased fetal movements, third trimester, not applicable or unspecified: Secondary | ICD-10-CM | POA: Diagnosis present

## 2019-01-02 DIAGNOSIS — Z3A40 40 weeks gestation of pregnancy: Secondary | ICD-10-CM | POA: Diagnosis not present

## 2019-01-02 DIAGNOSIS — O9081 Anemia of the puerperium: Secondary | ICD-10-CM | POA: Diagnosis not present

## 2019-01-02 DIAGNOSIS — D62 Acute posthemorrhagic anemia: Secondary | ICD-10-CM | POA: Diagnosis not present

## 2019-01-02 DIAGNOSIS — O480 Post-term pregnancy: Secondary | ICD-10-CM | POA: Diagnosis present

## 2019-01-02 DIAGNOSIS — O43123 Velamentous insertion of umbilical cord, third trimester: Secondary | ICD-10-CM | POA: Diagnosis present

## 2019-01-02 LAB — COMPREHENSIVE METABOLIC PANEL
ALBUMIN: 3.1 g/dL — AB (ref 3.5–5.0)
ALT: 6 U/L (ref 0–44)
AST: 14 U/L — ABNORMAL LOW (ref 15–41)
Alkaline Phosphatase: 118 U/L (ref 38–126)
Anion gap: 6 (ref 5–15)
BUN: 6 mg/dL (ref 6–20)
CO2: 22 mmol/L (ref 22–32)
Calcium: 8.5 mg/dL — ABNORMAL LOW (ref 8.9–10.3)
Chloride: 108 mmol/L (ref 98–111)
Creatinine, Ser: 0.36 mg/dL — ABNORMAL LOW (ref 0.44–1.00)
GFR calc Af Amer: >60 mL/min (ref >60)
GFR calc non Af Amer: >60 mL/min (ref >60)
GLUCOSE: 73 mg/dL (ref 70–99)
Potassium: 3.6 mmol/L (ref 3.5–5.1)
SODIUM: 136 mmol/L (ref 135–145)
Total Bilirubin: 0.4 mg/dL (ref 0.3–1.2)
Total Protein: 6.3 g/dL — ABNORMAL LOW (ref 6.5–8.1)

## 2019-01-02 LAB — PROTEIN / CREATININE RATIO, URINE
Creatinine, Urine: 150 mg/dL
PROTEIN CREATININE RATIO: 0.13 mg/mg{creat} (ref 0.00–0.15)
Total Protein, Urine: 20 mg/dL

## 2019-01-02 LAB — CBC
HCT: 27.2 % — ABNORMAL LOW (ref 36.0–46.0)
Hemoglobin: 8.6 g/dL — ABNORMAL LOW (ref 12.0–15.0)
MCH: 25.7 pg — AB (ref 26.0–34.0)
MCHC: 31.6 g/dL (ref 30.0–36.0)
MCV: 81.4 fL (ref 80.0–100.0)
Platelets: 252 10*3/uL (ref 150–400)
RBC: 3.34 MIL/uL — AB (ref 3.87–5.11)
RDW: 15.1 % (ref 11.5–15.5)
WBC: 9.5 10*3/uL (ref 4.0–10.5)
nRBC: 0 % (ref 0.0–0.2)

## 2019-01-02 LAB — CHLAMYDIA/NGC RT PCR (ARMC ONLY)
Chlamydia Tr: NOT DETECTED
N gonorrhoeae: NOT DETECTED

## 2019-01-02 MED ORDER — FENTANYL 2.5 MCG/ML W/ROPIVACAINE 0.15% IN NS 100 ML EPIDURAL (ARMC)
EPIDURAL | Status: AC
  Filled 2019-01-02: qty 100

## 2019-01-02 MED ORDER — FENTANYL 2.5 MCG/ML W/ROPIVACAINE 0.15% IN NS 100 ML EPIDURAL (ARMC)
EPIDURAL | Status: DC | PRN
  Administered 2019-01-02: 12 mL/h via EPIDURAL

## 2019-01-02 MED ORDER — LIDOCAINE HCL (PF) 1 % IJ SOLN
INTRAMUSCULAR | Status: DC | PRN
  Administered 2019-01-02: 1 mL

## 2019-01-02 MED ORDER — IBUPROFEN 600 MG PO TABS
600.0000 mg | ORAL_TABLET | Freq: Four times a day (QID) | ORAL | Status: DC
  Administered 2019-01-02 – 2019-01-04 (×6): 600 mg via ORAL
  Filled 2019-01-02 (×5): qty 1

## 2019-01-02 MED ORDER — MISOPROSTOL 200 MCG PO TABS
ORAL_TABLET | ORAL | Status: AC
  Filled 2019-01-02: qty 4

## 2019-01-02 MED ORDER — SODIUM CHLORIDE 0.9 % IV SOLN
INTRAVENOUS | Status: DC | PRN
  Administered 2019-01-02 (×3): 5 mL via EPIDURAL

## 2019-01-02 MED ORDER — OXYTOCIN 40 UNITS IN NORMAL SALINE INFUSION - SIMPLE MED: 1.0000 m[IU]/min | INTRAVENOUS | Status: DC

## 2019-01-02 MED ORDER — ONDANSETRON HCL 4 MG/2ML IJ SOLN
4.0000 mg | Freq: Four times a day (QID) | INTRAMUSCULAR | Status: DC | PRN
  Administered 2019-01-02: 4 mg via INTRAVENOUS
  Filled 2019-01-02: qty 2

## 2019-01-02 MED ORDER — EPHEDRINE 5 MG/ML INJ
10.0000 mg | INTRAVENOUS | Status: DC | PRN
  Filled 2019-01-02: qty 2

## 2019-01-02 MED ORDER — LACTATED RINGERS IV SOLN
INTRAVENOUS | Status: DC
  Administered 2019-01-02 (×2): via INTRAVENOUS

## 2019-01-02 MED ORDER — LIDOCAINE HCL (PF) 1 % IJ SOLN: 30.0000 mL | INTRAMUSCULAR | Status: DC | PRN

## 2019-01-02 MED ORDER — LACTATED RINGERS IV SOLN: 500.0000 mL | INTRAVENOUS | Status: DC | PRN

## 2019-01-02 MED ORDER — OXYCODONE-ACETAMINOPHEN 5-325 MG PO TABS
1.0000 | ORAL_TABLET | Freq: Once | ORAL | Status: AC
  Administered 2019-01-03: 1 via ORAL
  Filled 2019-01-02: qty 1

## 2019-01-02 MED ORDER — BUTORPHANOL TARTRATE 1 MG/ML IJ SOLN
1.0000 mg | INTRAMUSCULAR | Status: DC | PRN
  Administered 2019-01-02: 1 mg via INTRAVENOUS
  Filled 2019-01-02: qty 1

## 2019-01-02 MED ORDER — OXYTOCIN 40 UNITS IN NORMAL SALINE INFUSION - SIMPLE MED
INTRAVENOUS | Status: AC
  Filled 2019-01-02: qty 1000

## 2019-01-02 MED ORDER — MISOPROSTOL 25 MCG QUARTER TABLET
25.0000 ug | ORAL_TABLET | ORAL | Status: DC | PRN
  Administered 2019-01-02: 25 ug via BUCCAL
  Filled 2019-01-02: qty 1

## 2019-01-02 MED ORDER — OXYTOCIN BOLUS FROM INFUSION
500.0000 mL | Freq: Once | INTRAVENOUS | Status: AC
  Administered 2019-01-02: 500 mL via INTRAVENOUS

## 2019-01-02 MED ORDER — AMMONIA AROMATIC IN INHA
RESPIRATORY_TRACT | Status: AC
  Filled 2019-01-02: qty 10

## 2019-01-02 MED ORDER — DIPHENHYDRAMINE HCL 50 MG/ML IJ SOLN: 12.5000 mg | INTRAMUSCULAR | Status: DC | PRN

## 2019-01-02 MED ORDER — ACETAMINOPHEN 325 MG PO TABS
650.0000 mg | ORAL_TABLET | ORAL | Status: DC | PRN
  Administered 2019-01-02: 650 mg via ORAL
  Filled 2019-01-02: qty 2

## 2019-01-02 MED ORDER — SOD CITRATE-CITRIC ACID 500-334 MG/5ML PO SOLN: 30.0000 mL | ORAL | Status: DC | PRN

## 2019-01-02 MED ORDER — OXYTOCIN 40 UNITS IN NORMAL SALINE INFUSION - SIMPLE MED: 2.5000 [IU]/h | INTRAVENOUS | Status: DC

## 2019-01-02 MED ORDER — MISOPROSTOL 25 MCG QUARTER TABLET
25.0000 ug | ORAL_TABLET | ORAL | Status: DC | PRN
  Administered 2019-01-02: 25 ug via VAGINAL
  Filled 2019-01-02: qty 1

## 2019-01-02 MED ORDER — LIDOCAINE HCL (PF) 1 % IJ SOLN
INTRAMUSCULAR | Status: AC
  Filled 2019-01-02: qty 30

## 2019-01-02 MED ORDER — PHENYLEPHRINE 40 MCG/ML (10ML) SYRINGE FOR IV PUSH (FOR BLOOD PRESSURE SUPPORT)
80.0000 ug | PREFILLED_SYRINGE | INTRAVENOUS | Status: DC | PRN
  Filled 2019-01-02: qty 10

## 2019-01-02 MED ORDER — FENTANYL 2.5 MCG/ML W/ROPIVACAINE 0.15% IN NS 100 ML EPIDURAL (ARMC): 12.0000 mL/h | EPIDURAL | Status: DC

## 2019-01-02 MED ORDER — TERBUTALINE SULFATE 1 MG/ML IJ SOLN: 0.2500 mg | Freq: Once | INTRAMUSCULAR | Status: DC | PRN

## 2019-01-02 MED ORDER — LACTATED RINGERS IV SOLN
500.0000 mL | Freq: Once | INTRAVENOUS | Status: AC
  Administered 2019-01-02: 500 mL via INTRAVENOUS

## 2019-01-02 MED ORDER — IBUPROFEN 600 MG PO TABS
ORAL_TABLET | ORAL | Status: AC
  Filled 2019-01-02: qty 1

## 2019-01-02 MED ORDER — OXYTOCIN 10 UNIT/ML IJ SOLN
INTRAMUSCULAR | Status: AC
  Filled 2019-01-02: qty 2

## 2019-01-02 NOTE — Anesthesia Procedure Notes (Signed)
Epidural Patient location during procedure: OB Start time: 01/02/2019 8:47 PM End time: 01/02/2019 9:09 PM  Staffing Anesthesiologist: Jovita Gamma, MD Performed: anesthesiologist   Preanesthetic Checklist Completed: patient identified, site marked, surgical consent, pre-op evaluation, timeout performed, IV checked, risks and benefits discussed and monitors and equipment checked  Epidural Patient position: sitting Prep: ChloraPrep Patient monitoring: heart rate, continuous pulse ox and blood pressure Approach: midline Location: L4-L5 Injection technique: LOR saline  Needle:  Needle type: Tuohy  Needle gauge: 18 G Needle length: 9 cm and 9 Needle insertion depth: 6 cm Catheter type: closed end flexible Catheter size: 20 Guage Catheter at skin depth: 10 cm Test dose: negative and Other  Assessment Events: blood not aspirated, injection not painful, no injection resistance, negative IV test and no paresthesia  Additional Notes   Patient tolerated the insertion well without complications.Reason for block:procedure for pain
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
## 2019-01-02 NOTE — Discharge Instructions (Signed)

## 2019-01-02 NOTE — Anesthesia Preprocedure Evaluation (Signed)
Anesthesia Evaluation  Patient identified by MRN, date of birth, ID band Patient awake    Reviewed: Allergy & Precautions, H&P , NPO status , Patient's Chart, lab work & pertinent test results  Airway Mallampati: III       Dental  (+) Teeth Intact   Pulmonary neg pulmonary ROS,           Cardiovascular Exercise Tolerance: Good negative cardio ROS       Neuro/Psych PSYCHIATRIC DISORDERS Depression    GI/Hepatic negative GI ROS,   Endo/Other    Renal/GU   negative genitourinary   Musculoskeletal   Abdominal   Peds  Hematology  (+) Blood dyscrasia, anemia ,   Anesthesia Other Findings Past Medical History: No date: Anemia No date: Medical history non-contributory  Past Surgical History: No date: APPENDECTOMY  BMI    Body Mass Index:  28.35 kg/m      Reproductive/Obstetrics (+) Pregnancy                             Anesthesia Physical Anesthesia Plan  ASA: II  Anesthesia Plan: Epidural   Post-op Pain Management:    Induction:   PONV Risk Score and Plan:   Airway Management Planned:   Additional Equipment:   Intra-op Plan:   Post-operative Plan:   Informed Consent: I have reviewed the patients History and Physical, chart, labs and discussed the procedure including the risks, benefits and alternatives for the proposed anesthesia with the patient or authorized representative who has indicated his/her understanding and acceptance.       Plan Discussed with: Anesthesiologist  Anesthesia Plan Comments:         Anesthesia Quick Evaluation

## 2019-01-02 NOTE — Progress Notes (Signed)
IOL orders for admit 2/17

## 2019-01-02 NOTE — H&P (Signed)
OB History & Physical   History of Present Illness:  Chief Complaint: decreased fetal movement  HPI:  Sabrina Pace is a 23 y.o. [phone] female at [redacted]w[redacted]d dated by [redacted]w[redacted]d utlrasound.  She presents to L&D for decreased fetal movement at term. She reports that her baby is usually very active and she has only felt her move 2-3x/hour all of today.   She reports:  -decreased fetal movement -no leakage of fluid -no vaginal bleeding -no contractions  Pregnancy Issues: 1. 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 Anemia on iron supplement 3. Late entry to care at 18w 4. History of low birthweight infant at term 5. History of anxiety and depression 6. History of suicide attempt 02/2018 7. Positive PPD with normal chest x-ray 8. History of postpartum hemorrhage 9. History of preeclampsia x2    Maternal Medical History:   Past Medical History:  Diagnosis Date  . Anemia   . Medical history non-contributory     Past Surgical History:  Procedure Laterality Date  . APPENDECTOMY      No Known Allergies  Prior to Admission medications   Medication Sig Start Date End Date Taking? Authorizing Provider  Prenatal Vit-Fe Fumarate-FA (MULTIVITAMIN-PRENATAL) 27-0.8 MG TABS tablet Take 1 tablet by mouth daily at 12 noon.   Yes [provider]  amoxicillin-clavulanate (AUGMENTIN) 875-125 MG tablet Take 1 tablet by mouth every 12 (twelve) hours. Patient not taking: Reported on 01/02/2019 12/01/18   Genia Del, CNM  ferrous sulfate 325 (65 FE) MG EC tablet Take 325 mg by mouth daily with breakfast.    [provider]     Prenatal care site: [facility]   Social History: She  reports that she has never smoked. She has never used smokeless tobacco. She reports that she does not drink alcohol or use drugs.  Family History: family history includes Diabetes in her maternal grandmother and mother; Hypertension in her maternal grandmother.    Review of Systems: A full review of systems was performed and negative except as noted in the HPI.    Physical Exam:  Vital Signs: BP 103/67 (BP Location: Right Arm)   Pulse 97   Temp 98.3 F (36.8 C) (Oral)   Resp 18   Ht 5\' 2"  (1.575 m)   Wt 70.3 kg   LMP 04/02/2018   BMI 28.35 kg/m   General:   alert, cooperative, appears stated age and no distress  Skin:  normal and no rash or abnormalities  Neurologic:    Alert & oriented x 3  Lungs:   clear to auscultation bilaterally  Heart:   regular rate and rhythm, S1, S2 normal, no murmur, click, rub or gallop  Abdomen:  soft, non-tender; bowel sounds normal; no masses,  no organomegaly  Pelvis:  External genitalia: normal general appearance  FHT:  130 BPM  Presentations: cephalic  Cervix:    Dilation: 3cm   Effacement: 50%   Station:  -3   Consistency: medium   Position: middle  Extremities: : non-tender, symmetric, no edema bilaterally.    EFW: 7lb 8oz  No results found for this or any previous visit (from the past 24 hour(s)).  Pertinent Results:  Prenatal Labs: Blood type/Rh A+  Antibody screen neg  Rubella Immune  Varicella Immune  RPR NR  HBsAg Neg  HIV NR  GC neg  Chlamydia neg  Genetic screening negative  1 hour GTT 96  3 hour GTT n/a  GBS negative   FHT: FHR: 135  bpm, variability: moderate,  accelerations: present,  decelerations:  Absent Category/reactivity:  Category I TOCO: none  Assessment:  Sabrina Pace is a 23 y.o. [phone] female at [redacted]w[redacted]d with induction of labor for decreased fetal movement at term.   Plan:  1. Admit to Labor & Delivery; consents reviewed and obtained  2. Fetal Well being  - Fetal Tracing: category I - GBS negative - Presentation: cephalic confirmed by vaginal exam   3. Routine OB: - Prenatal labs reviewed, as above - Rh positive - CBC & T&S on admit - Clear fluids, IVF  4. Induction of Labor -  Contractions to be monitored with external toco in  place -  Pelvis proven to 3630g -  Plan for induction with misoprostol -  Plan for continuous fetal monitoring  -  Maternal pain control as desired: IVPM, nitrous, regional anesthesia -  Anticipate vaginal delivery  5. Post Partum Planning: - Infant feeding: breast and formula - Contraception: IUD vs BTL  Genia Del, CNM 01/02/2019 2:42 PM ----- Genia Del Certified Nurse Midwife [facility], Department of OB/GYN [facility]

## 2019-01-02 NOTE — Progress Notes (Signed)
Labor Progress Note  Sabrina Pace is a 23 y.o. [phone] at [redacted]w[redacted]d by58w5d ultrasound admitted for induction of labor due to decreased fetal movement at term.  Subjective:  Contractions are starting to get stronger. Got some relief from Stadol.   Objective: BP 103/67 (BP Location: Right Arm)   Pulse 97   Temp 98.3 F (36.8 C) (Oral)   Resp 18   Ht 5\' 2"  (1.575 m)   Wt 70.3 kg   LMP 04/02/2018   BMI 28.35 kg/m   Fetal Assessment: FHT:  FHR: 130 bpm, variability: moderate,  accelerations:  Present,  decelerations:  Absent Category/reactivity:  Category I UC:   regular, every 3-4 minutes SVE:    Dilation: 5cm  Effacement: 70%  Station:  -2  Consistency: soft  Position: middle  Membrane status: AROM 1935 Amniotic color: bloody  Labs: Lab Results  Component Value Date   WBC 9.5 01/02/2019   HGB 8.6 (L) 01/02/2019   HCT 27.2 (L) 01/02/2019   MCV 81.4 01/02/2019   PLT 252 01/02/2019    Assessment / Plan: Induction of labor due to decreased fetal movement at term  Labor: s/p one dose of misoprostol, s/p AROM now, plan to start pitocin at 2000 and titrate per protocl Fetal Wellbeing:  Category I Pain Control:  Maternal pain control as desired: IVPM, nitrous, regional anesthesia Anticipated MOD:  NSVD  Genia Del, CNM 01/02/2019, 7:38 PM

## 2019-01-02 NOTE — Progress Notes (Signed)
At 2018, Interpreter [phone] utilized by Clarisa Kindred for pt assessment.  In person interpreter utilized during epidural placement and subsequent delivery.

## 2019-01-03 LAB — CBC
HCT: 24.8 % — ABNORMAL LOW (ref 36.0–46.0)
HEMOGLOBIN: 7.8 g/dL — AB (ref 12.0–15.0)
MCH: 25.6 pg — ABNORMAL LOW (ref 26.0–34.0)
MCHC: 31.5 g/dL (ref 30.0–36.0)
MCV: 81.3 fL (ref 80.0–100.0)
PLATELETS: 217 10*3/uL (ref 150–400)
RBC: 3.05 MIL/uL — AB (ref 3.87–5.11)
RDW: 15.2 % (ref 11.5–15.5)
WBC: 11.5 10*3/uL — ABNORMAL HIGH (ref 4.0–10.5)
nRBC: 0 % (ref 0.0–0.2)

## 2019-01-03 LAB — RPR: RPR Ser Ql: NONREACTIVE

## 2019-01-03 MED ORDER — DIPHENHYDRAMINE HCL 25 MG PO CAPS: 25.0000 mg | ORAL_CAPSULE | Freq: Four times a day (QID) | ORAL | Status: DC | PRN

## 2019-01-03 MED ORDER — WITCH HAZEL-GLYCERIN EX PADS: 1.0000 "application " | MEDICATED_PAD | CUTANEOUS | Status: DC | PRN

## 2019-01-03 MED ORDER — FERROUS SULFATE 325 (65 FE) MG PO TABS
325.0000 mg | ORAL_TABLET | Freq: Two times a day (BID) | ORAL | Status: DC
  Administered 2019-01-03 (×2): 325 mg via ORAL
  Filled 2019-01-03 (×3): qty 1

## 2019-01-03 MED ORDER — VITAMIN C 500 MG PO TABS
500.0000 mg | ORAL_TABLET | Freq: Two times a day (BID) | ORAL | Status: DC
  Administered 2019-01-03 (×2): 500 mg via ORAL
  Filled 2019-01-03 (×3): qty 1

## 2019-01-03 MED ORDER — DIBUCAINE 1 % RE OINT: 1.0000 "application " | TOPICAL_OINTMENT | RECTAL | Status: DC | PRN

## 2019-01-03 MED ORDER — BENZOCAINE-MENTHOL 20-0.5 % EX AERO
1.0000 "application " | INHALATION_SPRAY | CUTANEOUS | Status: DC | PRN
  Filled 2019-01-03: qty 56

## 2019-01-03 MED ORDER — COCONUT OIL OIL: 1.0000 "application " | TOPICAL_OIL | Status: DC | PRN

## 2019-01-03 MED ORDER — ACETAMINOPHEN 325 MG PO TABS
650.0000 mg | ORAL_TABLET | ORAL | Status: DC | PRN
  Administered 2019-01-03: 650 mg via ORAL
  Filled 2019-01-03: qty 2

## 2019-01-03 MED ORDER — SENNOSIDES-DOCUSATE SODIUM 8.6-50 MG PO TABS
2.0000 | ORAL_TABLET | ORAL | Status: DC
  Administered 2019-01-03: 2 via ORAL
  Filled 2019-01-03 (×2): qty 2

## 2019-01-03 MED ORDER — PRENATAL MULTIVITAMIN CH
1.0000 | ORAL_TABLET | Freq: Every day | ORAL | Status: DC
  Administered 2019-01-03: 1 via ORAL
  Filled 2019-01-03: qty 1

## 2019-01-03 MED ORDER — ONDANSETRON HCL 4 MG PO TABS: 4.0000 mg | ORAL_TABLET | ORAL | Status: DC | PRN

## 2019-01-03 MED ORDER — SIMETHICONE 80 MG PO CHEW: 160.0000 mg | CHEWABLE_TABLET | ORAL | Status: DC | PRN

## 2019-01-03 MED ORDER — ONDANSETRON HCL 4 MG/2ML IJ SOLN: 4.0000 mg | INTRAMUSCULAR | Status: DC | PRN

## 2019-01-03 NOTE — Anesthesia Postprocedure Evaluation (Signed)
Anesthesia Post Note  Patient: Sabrina Pace  Procedure(s) Performed: AN AD HOC LABOR EPIDURAL  Patient location during evaluation: Mother Baby Anesthesia Type: Epidural Level of consciousness: awake and alert Pain management: pain level controlled Vital Signs Assessment: post-procedure vital signs reviewed and stable Respiratory status: spontaneous breathing, nonlabored ventilation and respiratory function stable Cardiovascular status: stable Postop Assessment: no headache, no backache and epidural receding Anesthetic complications: no     Last Vitals:  Vitals:   01/03/19 0215 01/03/19 0331  BP: 111/68 111/63  Pulse: 78 79  Resp: 18 18  Temp: 36.9 C 36.6 C  SpO2: 98% 100%    Last Pain:  Vitals:   01/03/19 0331  TempSrc: Oral  PainSc:                  Jules Schick

## 2019-01-03 NOTE — Progress Notes (Signed)
Spanish Interpreter, Olegario Messier [phone] used to interpret admission for this patient. RN went over room orientation, baby safety, when assessments and vital signs will take place, admission paperwork, patient safety and asked that she call for RN before getting out of bed. RN went over what to expect with patient's bleeding and when she should call the RN. RN discussed patient's meds and current level of pain. Previous teaching on breastfeeding expanded on and patient states that at the present time she doesn't have any questions or concerns.

## 2019-01-03 NOTE — Progress Notes (Signed)
Post Partum Day 1 Subjective: Cramping and bleeding, normal amount Breastfeeding  Objective: Blood pressure 99/68, pulse 73, temperature 98.7 F (37.1 C), temperature source Oral, resp. rate 20, height 5\' 2"  (1.575 m), weight 70.3 kg, last menstrual period 04/02/2018, SpO2 99 %, unknown if currently breastfeeding.  Physical Exam:  General: alert, cooperative and appears stated age Lochia: appropriate Uterine Fundus: firm DVT Evaluation: No evidence of DVT seen on physical exam.  Recent Labs    01/02/19 1441 01/03/19 0514  HGB 8.6* 7.8*  HCT 27.2* 24.8*    Assessment/Plan: Plan for discharge tomorrow and Breastfeeding  -Acute blood loss anemia - hemodynamically stable and asymptomatic; start PO ferrous sulfate BID with stool softeners.  - continue standard postpartum care   LOS: 1 day   Christeen Douglas 01/03/2019, 6:46 PM

## 2019-01-03 NOTE — Lactation Note (Signed)
This note was copied from a baby's chart. Lactation Consultation Note  Patient Name: Sabrina Pace EFEOF'H Date: 01/03/2019 Reason for consult: Initial assessment   Maternal Data    Feeding Feeding Type: Breast Fed  LATCH Score Latch: Repeated attempts needed to sustain latch, nipple held in mouth throughout feeding, stimulation needed to elicit sucking reflex.  Audible Swallowing: A few with stimulation  Type of Nipple: Everted at rest and after stimulation  Comfort (Breast/Nipple): Soft / non-tender  Hold (Positioning): Assistance needed to correctly position infant at breast and maintain latch.  LATCH Score: 7  Interventions Interventions: Breast feeding basics reviewed;Assisted with latch;Adjust position;Support pillows  Lactation Tools Discussed/Used     Consult Status Consult Status: Follow-up Date: 01/03/19 Follow-up type: In-patient  LC assisted mother with latch and positioning of infant in the side lying position. Infant was able to latch but initially grabbed the nipple but LC adjusted lips and flanged them and infant was able to achieve a deeper latch. Mother states that the latch hurt initially but the pain started to go away once her lips were adjusted. Mother states that she would like to do both breast and formula. LC encouraged mother to breastfeed infant first before offering formula and educated on the importance of exclusive breastfeeding for at least 4-6 weeks to establish milk supply.    Arlyss Gandy 01/03/2019, 3:06 PM

## 2019-01-04 MED ORDER — FERROUS SULFATE 325 (65 FE) MG PO TBEC: 325.0000 mg | DELAYED_RELEASE_TABLET | Freq: Two times a day (BID) | ORAL | 1 refills | Status: DC

## 2019-01-04 MED ORDER — ACETAMINOPHEN 325 MG PO TABS: 650.0000 mg | ORAL_TABLET | ORAL | 0 refills | Status: DC | PRN

## 2019-01-04 MED ORDER — IBUPROFEN 600 MG PO TABS: 600.0000 mg | ORAL_TABLET | Freq: Four times a day (QID) | ORAL | 0 refills | Status: DC | PRN

## 2019-01-04 MED ORDER — ASCORBIC ACID 500 MG PO TABS: 500.0000 mg | ORAL_TABLET | Freq: Two times a day (BID) | ORAL | 1 refills | Status: DC

## 2019-01-04 NOTE — Progress Notes (Signed)
Dc to home with NB.  To car via auxillary staff via wc.

## 2019-01-04 NOTE — Progress Notes (Signed)
DC inst given to mother via the interpreter line.  Mom verb u/o.

## 2019-01-05 LAB — TYPE AND SCREEN
ABO/RH(D): A POS
Antibody Screen: POSITIVE
Unit division: 0
Unit division: 0
Unit division: 0
Unit division: 0

## 2019-01-05 LAB — BPAM RBC
BLOOD PRODUCT EXPIRATION DATE: 202003032359
Blood Product Expiration Date: 202002192359
Blood Product Expiration Date: 202002262359
Blood Product Expiration Date: 202003032359
UNIT TYPE AND RH: 5100
Unit Type and Rh: 5100
Unit Type and Rh: 600
Unit Type and Rh: 600

## 2019-03-25 ENCOUNTER — Encounter (HOSPITAL_COMMUNITY): Payer: Self-pay

## 2019-04-14 ENCOUNTER — Other Ambulatory Visit: Payer: Self-pay

## 2019-04-14 ENCOUNTER — Emergency Department
Admission: EM | Admit: 2019-04-14 | Discharge: 2019-04-14 | Disposition: A | Discharge status: Home / Self Care | Payer: Self-pay | Attending: Emergency Medicine | Admitting: Emergency Medicine

## 2019-04-14 ENCOUNTER — Encounter: Payer: Self-pay | Admitting: *Deleted

## 2019-04-14 DIAGNOSIS — K047 Periapical abscess without sinus: Secondary | ICD-10-CM | POA: Insufficient documentation

## 2019-04-14 DIAGNOSIS — Z79899 Other long term (current) drug therapy: Secondary | ICD-10-CM | POA: Insufficient documentation

## 2019-04-14 MED ORDER — TRAMADOL HCL 50 MG PO TABS
50.0000 mg | ORAL_TABLET | Freq: Once | ORAL | Status: AC
  Administered 2019-04-14: 50 mg via ORAL
  Filled 2019-04-14: qty 1

## 2019-04-14 MED ORDER — KETOROLAC TROMETHAMINE 10 MG PO TABS: 10.0000 mg | ORAL_TABLET | Freq: Four times a day (QID) | ORAL | 0 refills | Status: AC | PRN

## 2019-04-14 MED ORDER — AMOXICILLIN 500 MG PO CAPS
500.0000 mg | ORAL_CAPSULE | Freq: Once | ORAL | Status: AC
  Administered 2019-04-14: 500 mg via ORAL

## 2019-04-14 MED ORDER — KETOROLAC TROMETHAMINE 30 MG/ML IJ SOLN
30.0000 mg | Freq: Once | INTRAMUSCULAR | Status: AC
  Administered 2019-04-14: 30 mg via INTRAMUSCULAR
  Filled 2019-04-14: qty 1

## 2019-04-14 MED ORDER — TRAMADOL HCL 50 MG PO TABS: 50.0000 mg | ORAL_TABLET | Freq: Four times a day (QID) | ORAL | 0 refills | Status: AC | PRN

## 2019-04-14 MED ORDER — AMOXICILLIN 875 MG PO TABS: 875.0000 mg | ORAL_TABLET | Freq: Two times a day (BID) | ORAL | 0 refills | Status: AC

## 2019-04-14 NOTE — ED Triage Notes (Addendum)
Pt presents w/ c/o dental pain, swelling to upper lip since this morning. Pt has taken tylenol 500 mg last at 1700 today. Pt has no dentist.Pt denies fever and cough, exposure to anyone w/ fever or cough, denies + covid 19 exposure or testing.

## 2019-04-14 NOTE — Discharge Instructions (Signed)
OPTIONS FOR DENTAL FOLLOW UP CARE ° °[place] [facility] - Local Safety Net Dental Clinics °http://www.ncdhhs.gov/dph/oralhealth/services/safetynetclinics.htm °  °[facility] (336-562-3123) ° °[facility] [place] ([phone]) ° °[facility] ([phone] ext [phone]) ° ° [facility] ([phone]) ° °[facility] ([phone]) °This clinic caters to the indigent population and is on a lottery system. °Location: °[facility], [address], [place] °Clinic Hours: °Wednesdays from 6pm - 9pm, patients seen by a lottery system. °For dates, call or go to www.med.[facility].edu/[facility]/patients/Dental-[facility] °Services: °Cleanings, fillings and simple extractions. °Payment Options: °DENTAL WORK IS FREE OF CHARGE. Bring proof of income or support. °Best way to get seen: °Arrive at 5:15 pm - this is a lottery, NOT first come/first serve, so arriving earlier will not increase your chances of being seen. °  °  °[facility] °[phone] °Select option 1 for emergencies °  °Location: °[facility], [address], [place] °Clinic Hours: °No walk-ins accepted - call the day before to schedule an appointment. °Check in times are 9:30 am and 1:30 pm. °Services: °Simple extractions, temporary fillings, pulpectomy/pulp debridement, uncomplicated abscess drainage. °Payment Options: °PAYMENT IS DUE AT THE TIME OF SERVICE.  Fee is usually $100-200, additional surgical procedures (e.g. abscess drainage) may be extra. °Cash, checks, Visa/MasterCard accepted.  Can file Medicaid if patient is covered for dental - patient should call case worker to check. °No discount for [facility] patients. °Best way to get seen: °MUST call the day before and get onto the schedule. Can usually be seen the next 1-2 days. No walk-ins accepted. °  °  °[facility] °[phone] °   °Location: °[facility], [address], [place] °Clinic Hours: °M, W, Th, F 8am or 1:30pm, Tues 9a or 1:30 - first come/first served. °Services: °Simple extractions, temporary fillings, uncomplicated abscess drainage.  You do not need to be an [place] resident. °Payment Options: °PAYMENT IS DUE AT THE TIME OF SERVICE. °Dental insurance, otherwise sliding scale - bring proof of income or support. °Depending on income and treatment needed, cost is usually $50-200. °Best way to get seen: °Arrive early as it is first come/first served. °  °  °[facility] °[phone] °  °Location: °[address] °Clinic Hours: °Mon-Thu 8a-5p °Services: °Most basic dental services including extractions and fillings. °Payment Options: °PAYMENT IS DUE AT THE TIME OF SERVICE. °Sliding scale, up to 50% off - bring proof if income or support. °Medicaid with dental option accepted. °Best way to get seen: °Call to schedule an appointment, can usually be seen within 2 weeks OR they will try to see walk-ins - show up at 8a or 2p (you may have to wait). °  °  °[facility] °919-245-2435 °[place] RESIDENTS ONLY °  °Location: °[facility], [address], [place], [place] [address] °Clinic Hours: By appointment only. °Monday - Thursday 8am-5pm, Friday 8am-12pm °Services: Cleanings, fillings, extractions. °Payment Options: °PAYMENT IS DUE AT THE TIME OF SERVICE. °Cash, Visa or MasterCard. Sliding scale - $30 minimum per service. °Best way to get seen: °Come in to office, complete packet and make an appointment - need proof of income °or support monies for each household member and proof of [place] residence. °Usually takes about a month to get in. °  °  °[facility] °[phone] °  °Location: °[address]   [place] °Clinic Hours: Walk-in Urgent Care Dental Services are offered Monday-Friday  mornings only. °The numbers of emergencies accepted daily is limited to the number of °providers available. °Maximum 15 - Mondays, Wednesdays & Thursdays °Maximum 10 - Tuesdays & Fridays °Services: °You do not need to be a [place] resident to be seen for a dental emergency. °Emergencies are defined as pain, swelling, abnormal bleeding, or dental trauma. Walkins will receive x-rays if needed. °NOTE: Dental cleaning is not an emergency. °Payment Options: °PAYMENT IS DUE AT THE TIME OF SERVICE. °Minimum co-pay is $40.00 for uninsured patients. °Minimum co-pay is $3.00 for Medicaid with dental coverage. °Dental Insurance is accepted and must be presented at time of visit. °Medicare does not cover dental. °Forms of payment: Cash, credit card, checks. °Best way to get seen: °If not previously registered with the clinic, walk-in dental registration begins at 7:15 am and is on a first come/first serve basis. °If previously registered with the clinic, call to make an appointment. °  °  °[facility] °[phone] °[place] RESIDENTS ONLY °  °Location: °[address], [place], [place] °Clinic Hours: °Mon-Thu 10a-2p °Services: Extractions only! °Payment Options: °FREE (donations accepted) - bring proof of income or support °Best way to get seen: °Call and schedule an appointment OR come at 8am on the 1st Monday of every month (except for holidays) when it is first come/first served. °  °  °[facility] °[phone] °  °Location: °[address], [place] °Clinic Hours: °Friday mornings °Services, Payment Options, Best way to get seen: °Call for info °
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
## 2019-05-31 ENCOUNTER — Other Ambulatory Visit: Payer: Self-pay | Admitting: *Deleted

## 2019-05-31 DIAGNOSIS — Z20822 Contact with and (suspected) exposure to covid-19: Secondary | ICD-10-CM

## 2019-06-01 DIAGNOSIS — R7611 Nonspecific reaction to tuberculin skin test without active tuberculosis: Secondary | ICD-10-CM

## 2019-06-04 ENCOUNTER — Ambulatory Visit: Payer: Self-pay

## 2019-06-05 LAB — NOVEL CORONAVIRUS, NAA: SARS-CoV-2, NAA: NOT DETECTED

## 2020-01-03 ENCOUNTER — Emergency Department
Admission: EM | Admit: 2020-01-03 | Discharge: 2020-01-03 | Disposition: A | Discharge status: Home / Self Care | Payer: HRSA Program | Attending: Student in an Organized Health Care Education/Training Program | Admitting: Student in an Organized Health Care Education/Training Program

## 2020-01-03 ENCOUNTER — Other Ambulatory Visit: Payer: Self-pay

## 2020-01-03 ENCOUNTER — Emergency Department: Payer: HRSA Program

## 2020-01-03 ENCOUNTER — Encounter: Payer: Self-pay | Admitting: Emergency Medicine

## 2020-01-03 DIAGNOSIS — U071 COVID-19: Secondary | ICD-10-CM | POA: Diagnosis not present

## 2020-01-03 DIAGNOSIS — Z79899 Other long term (current) drug therapy: Secondary | ICD-10-CM | POA: Insufficient documentation

## 2020-01-03 DIAGNOSIS — J111 Influenza due to unidentified influenza virus with other respiratory manifestations: Secondary | ICD-10-CM

## 2020-01-03 DIAGNOSIS — R509 Fever, unspecified: Secondary | ICD-10-CM | POA: Diagnosis present

## 2020-01-03 LAB — SARS CORONAVIRUS 2 (TAT 6-24 HRS): SARS Coronavirus 2: POSITIVE — AB

## 2020-01-03 MED ORDER — PSEUDOEPH-BROMPHEN-DM 30-2-10 MG/5ML PO SYRP: 5.0000 mL | ORAL_SOLUTION | Freq: Four times a day (QID) | ORAL | 0 refills | Status: DC | PRN

## 2020-01-03 MED ORDER — IBUPROFEN 600 MG PO TABS: 600.0000 mg | ORAL_TABLET | Freq: Three times a day (TID) | ORAL | 0 refills | Status: DC | PRN
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
## 2020-01-03 NOTE — Discharge Instructions (Signed)
Follow discharge care instruction take medication as directed.  Advised self quarantine pending results of COVID-19 test.  You have been notified telephonically if test is positive.  You may also follow your lab results in the MyChart app.

## 2020-01-03 NOTE — ED Triage Notes (Signed)
Patient presents to the ED with fever, body aches, loss of taste, and cough.  Patient states she started feeling bad on Monday.  Patient is in no obvious distress at this time.  Patient denies any covid19 contacts that she knows of.

## 2020-01-07 ENCOUNTER — Telehealth: Payer: Self-pay | Admitting: Emergency Medicine

## 2020-01-07 NOTE — Telephone Encounter (Signed)
Called patient to assure she is aware of covid result. used pacific interpretter [phone].  I explained positive result and need for isolation of she and her children.  Isolation and quarantine guidelines

## 2020-01-30 ENCOUNTER — Ambulatory Visit: Payer: Self-pay

## 2020-10-22 ENCOUNTER — Other Ambulatory Visit: Payer: Self-pay

## 2020-10-22 ENCOUNTER — Ambulatory Visit (LOCAL_COMMUNITY_HEALTH_CENTER): Payer: Medicaid Other

## 2020-10-22 VITALS — BP 103/51 | Ht 61.5 in | Wt 145.0 lb

## 2020-10-22 DIAGNOSIS — Z3201 Encounter for pregnancy test, result positive: Secondary | ICD-10-CM

## 2020-10-22 LAB — PREGNANCY, URINE: Preg Test, Ur: POSITIVE — AB

## 2020-10-22 MED ORDER — PRENATAL VITAMIN 27-0.8 MG PO TABS: 1.0000 | ORAL_TABLET | Freq: Every day | ORAL | 0 refills | Status: AC

## 2020-10-22 NOTE — Progress Notes (Signed)
Pt desires care at [facility]; sent to preadmit.

## 2020-10-24 ENCOUNTER — Other Ambulatory Visit: Payer: Self-pay

## 2020-10-24 ENCOUNTER — Ambulatory Visit: Payer: Medicaid Other | Admitting: Advanced Practice Midwife

## 2020-10-24 ENCOUNTER — Encounter: Payer: Self-pay | Admitting: Advanced Practice Midwife

## 2020-10-24 VITALS — BP 110/55 | HR 75 | Temp 98.0°F | Wt 145.6 lb

## 2020-10-24 DIAGNOSIS — Z62810 Personal history of physical and sexual abuse in childhood: Secondary | ICD-10-CM

## 2020-10-24 DIAGNOSIS — Z8489 Family history of other specified conditions: Secondary | ICD-10-CM | POA: Insufficient documentation

## 2020-10-24 DIAGNOSIS — O99011 Anemia complicating pregnancy, first trimester: Secondary | ICD-10-CM | POA: Diagnosis not present

## 2020-10-24 DIAGNOSIS — T1491XA Suicide attempt, initial encounter: Secondary | ICD-10-CM

## 2020-10-24 DIAGNOSIS — O0991 Supervision of high risk pregnancy, unspecified, first trimester: Secondary | ICD-10-CM | POA: Diagnosis not present

## 2020-10-24 DIAGNOSIS — O99019 Anemia complicating pregnancy, unspecified trimester: Secondary | ICD-10-CM | POA: Insufficient documentation

## 2020-10-24 HISTORY — DX: Supervision of high risk pregnancy, unspecified, first trimester: O09.91

## 2020-10-24 HISTORY — DX: Suicide attempt, initial encounter: T14.91XA

## 2020-10-24 HISTORY — DX: Personal history of physical and sexual abuse in childhood: Z62.810

## 2020-10-24 LAB — URINALYSIS
Bilirubin, UA: NEGATIVE
Glucose, UA: NEGATIVE
Ketones, UA: NEGATIVE
Leukocytes,UA: NEGATIVE
Nitrite, UA: NEGATIVE
Protein,UA: NEGATIVE
Specific Gravity, UA: 1.025 (ref 1.005–1.030)
Urobilinogen, Ur: 0.2 mg/dL (ref 0.2–1.0)
pH, UA: 7.5 (ref 5.0–7.5)

## 2020-10-24 LAB — WET PREP FOR TRICH, YEAST, CLUE
Trichomonas Exam: NEGATIVE
Yeast Exam: NEGATIVE

## 2020-10-24 LAB — HEMOGLOBIN, FINGERSTICK: Hemoglobin: 10.7 g/dL — ABNORMAL LOW (ref 11.1–15.9)
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
## 2020-10-24 NOTE — Progress Notes (Signed)
Patient here with son for New OB visit. Denies hospital/provider visits during this pregnancy. Patient rents apartment, but cooks at her mom's house. Has family helps with rent and food, has food stamps, counseled to visit WIC. States she has cockroaches in her appartment and her landlord is planning to take care to take care of it. Uses bus system for transportation and states she has a sister that can take her to [facility] appointments.Burt Knack, RN

## 2020-10-24 NOTE — Progress Notes (Addendum)
[facility] U/S referral faxed with confirmation. Patient aware that [facility] will call her to schedule appointment. Patient in house labs reviewed, patient treated for anemia per SO. Given iron and counseled to take 1 tablet/day with vitamin C juice. Anemia in pregnancy pamphlet given. Patient counseled to go to [facility] and states understanding. [facility] pager card given. Patient desires flu vaccine at next visit. [facility] financial planning number given to patient as she is considering a  BTL .Burt Knack, RN

## 2020-10-25 LAB — FE+CBC/D/PLT+TIBC+FER+RETIC
Basophils Absolute: 0 10*3/uL (ref 0.0–0.2)
Basos: 0 %
EOS (ABSOLUTE): 0.1 10*3/uL (ref 0.0–0.4)
Eos: 1 %
Ferritin: 5 ng/mL — ABNORMAL LOW (ref 15–150)
Hematocrit: 33.7 % — ABNORMAL LOW (ref 34.0–46.6)
Hemoglobin: 11 g/dL — ABNORMAL LOW (ref 11.1–15.9)
Immature Grans (Abs): 0.1 10*3/uL (ref 0.0–0.1)
Immature Granulocytes: 1 %
Iron Saturation: 10 % — ABNORMAL LOW (ref 15–55)
Iron: 39 ug/dL (ref 27–159)
Lymphocytes Absolute: 2.4 10*3/uL (ref 0.7–3.1)
Lymphs: 24 %
MCH: 27.7 pg (ref 26.6–33.0)
MCHC: 32.6 g/dL (ref 31.5–35.7)
MCV: 85 fL (ref 79–97)
Monocytes Absolute: 0.7 10*3/uL (ref 0.1–0.9)
Monocytes: 7 %
Neutrophils Absolute: 6.5 10*3/uL (ref 1.4–7.0)
Neutrophils: 67 %
Platelets: 363 10*3/uL (ref 150–450)
RBC: 3.97 x10E6/uL (ref 3.77–5.28)
RDW: 15.4 % (ref 11.7–15.4)
Retic Ct Pct: 1.4 % (ref 0.6–2.6)
Total Iron Binding Capacity: 383 ug/dL (ref 250–450)
UIBC: 344 ug/dL (ref 131–425)
WBC: 9.7 10*3/uL (ref 3.4–10.8)

## 2020-10-26 LAB — URINE CULTURE

## 2020-10-27 LAB — CHLAMYDIA/GC NAA, CONFIRMATION
Chlamydia trachomatis, NAA: NEGATIVE
Neisseria gonorrhoeae, NAA: NEGATIVE

## 2020-10-27 LAB — HGB FRACTIONATION CASCADE
Hgb A2: 2.6 % (ref 1.8–3.2)
Hgb A: 97.4 % (ref 96.4–98.8)
Hgb F: 0 % (ref 0.0–2.0)
Hgb S: 0 %

## 2020-10-28 LAB — IGP, RFX APTIMA HPV ASCU: PAP Smear Comment: 0

## 2020-10-28 NOTE — Progress Notes (Signed)
Per [facility], [place] has been scheduled for 11/03/2020 at 1:30 pm [facility]). Jossie Ng, RN

## 2020-10-29 LAB — CBC/D/PLT+RPR+RH+ABO+RUB AB...
Basophils Absolute: 0 10*3/uL (ref 0.0–0.2)
Basos: 0 %
EOS (ABSOLUTE): 0.1 10*3/uL (ref 0.0–0.4)
Eos: 1 %
Hematocrit: 32.9 % — ABNORMAL LOW (ref 34.0–46.6)
Hemoglobin: 11 g/dL — ABNORMAL LOW (ref 11.1–15.9)
Hepatitis B Surface Ag: NEGATIVE
Immature Grans (Abs): 0 10*3/uL (ref 0.0–0.1)
Immature Granulocytes: 0 %
Lymphocytes Absolute: 2.4 10*3/uL (ref 0.7–3.1)
Lymphs: 26 %
MCH: 28.1 pg (ref 26.6–33.0)
MCHC: 33.4 g/dL (ref 31.5–35.7)
MCV: 84 fL (ref 79–97)
Monocytes Absolute: 0.6 10*3/uL (ref 0.1–0.9)
Monocytes: 6 %
Neutrophils Absolute: 6.2 10*3/uL (ref 1.4–7.0)
Neutrophils: 67 %
Platelets: 367 10*3/uL (ref 150–450)
RBC: 3.92 x10E6/uL (ref 3.77–5.28)
RDW: 15.6 % — ABNORMAL HIGH (ref 11.7–15.4)
RPR Ser Ql: NONREACTIVE
Rh Factor: POSITIVE
Rubella Antibodies, IGG: 1.12 index (ref >0.99)
Varicella zoster IgG: 1351 index (ref >165)
WBC: 9.2 10*3/uL (ref 3.4–10.8)

## 2020-10-29 LAB — 789231 7+OXYCODONE-BUND
Amphetamines, Urine: NEGATIVE ng/mL
BENZODIAZ UR QL: NEGATIVE ng/mL
Barbiturate screen, urine: NEGATIVE ng/mL
Cannabinoid Quant, Ur: NEGATIVE ng/mL
Cocaine (Metab.): NEGATIVE ng/mL
OPIATE SCREEN URINE: NEGATIVE ng/mL
Oxycodone/Oxymorphone, Urine: NEGATIVE ng/mL
PCP Quant, Ur: NEGATIVE ng/mL

## 2020-10-29 LAB — HGB FRACTIONATION CASCADE
Hgb A2: 2.6 % (ref 1.8–3.2)
Hgb A: 97.4 % (ref 96.4–98.8)
Hgb F: 0 % (ref 0.0–2.0)
Hgb S: 0 %

## 2020-10-29 LAB — AB SCR+ANTIBODY ID
Antibody Screen: POSITIVE — AB
Coombs Titer #1: 32

## 2020-10-29 LAB — HIV ANTIBODY (ROUTINE TESTING W REFLEX): HIV Screen 4th Generation wRfx: NONREACTIVE

## 2020-10-29 LAB — HCV INTERPRETATION

## 2020-10-29 LAB — HCV AB W REFLEX TO QUANT PCR: HCV Ab: <0.1 s/co ratio (ref 0.0–0.9)

## 2020-11-20 ENCOUNTER — Ambulatory Visit: Payer: Medicaid Other | Admitting: Physician Assistant

## 2020-11-20 ENCOUNTER — Other Ambulatory Visit: Payer: Self-pay

## 2020-11-20 ENCOUNTER — Encounter: Payer: Self-pay | Admitting: Physician Assistant

## 2020-11-20 VITALS — BP 92/60 | HR 90 | Temp 98.7°F | Wt 143.0 lb

## 2020-11-20 DIAGNOSIS — O99019 Anemia complicating pregnancy, unspecified trimester: Secondary | ICD-10-CM

## 2020-11-20 DIAGNOSIS — F4325 Adjustment disorder with mixed disturbance of emotions and conduct: Secondary | ICD-10-CM | POA: Diagnosis not present

## 2020-11-20 DIAGNOSIS — O0991 Supervision of high risk pregnancy, unspecified, first trimester: Secondary | ICD-10-CM

## 2020-11-20 DIAGNOSIS — Z23 Encounter for immunization: Secondary | ICD-10-CM

## 2020-11-20 LAB — HEMOGLOBIN, FINGERSTICK: Hemoglobin: 10.7 g/dL — ABNORMAL LOW (ref 11.1–15.9)

## 2020-11-20 MED ORDER — PROMETHAZINE HCL 25 MG PO TABS: 25.0000 mg | ORAL_TABLET | Freq: Four times a day (QID) | ORAL | 0 refills | Status: DC | PRN

## 2020-11-20 MED ORDER — PROMETHAZINE HCL 25 MG PO TABS: 25.0000 mg | ORAL_TABLET | Freq: Four times a day (QID) | ORAL | 1 refills | Status: DC | PRN
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
## 2020-11-20 NOTE — Progress Notes (Signed)
Reviewed 11/03/20 US showing [redacted]w[redacted]d IUP: EDC changed to 06/11/21.

## 2020-11-20 NOTE — Progress Notes (Signed)
Correctly verbalizrs how to take PNV and iron tablet. Taking iron tablet with orange juice. Hgb today. Aware of [facility] anatomy US appt on2/28/2022 at 3 pm ([place]). Provided [place] counselor numbers x3 as desires BTL post-partum. Jossie Ng, RN  Hgb reviewed and to continue iron QD as above. Jossie Ng, RN

## 2020-11-23 NOTE — Progress Notes (Signed)
Chart reviewed by Pharmacist  Suzanne Walker PharmD, Contract Pharmacist at [facility]  

## 2020-12-18 ENCOUNTER — Other Ambulatory Visit: Payer: Self-pay

## 2020-12-18 ENCOUNTER — Ambulatory Visit: Payer: Medicaid Other | Admitting: Physician Assistant

## 2020-12-18 VITALS — BP 102/56 | HR 78 | Temp 97.5°F | Wt 147.8 lb

## 2020-12-18 DIAGNOSIS — O99019 Anemia complicating pregnancy, unspecified trimester: Secondary | ICD-10-CM | POA: Diagnosis not present

## 2020-12-18 DIAGNOSIS — O0991 Supervision of high risk pregnancy, unspecified, first trimester: Secondary | ICD-10-CM

## 2020-12-18 DIAGNOSIS — F4325 Adjustment disorder with mixed disturbance of emotions and conduct: Secondary | ICD-10-CM

## 2020-12-18 LAB — HEMOGLOBIN, FINGERSTICK: Hemoglobin: 10.5 g/dL — ABNORMAL LOW (ref 11.1–15.9)

## 2020-12-18 NOTE — Progress Notes (Signed)
Aware of 01/19/21 [facility] anatomy US at 3 pm (arrive by 2:30 pm). Client to request financial letter from [facility] finance today to take with her ine effort to obtain reduced cost [place]. Also given number to call to verify cost of [place] with letter. Verbalized correct way to take PNV and iron tablet. Taking withj Vitamin C beverage. Jossie Ng, RN  Hgb = 10.5 and to continue iron QD. Jossie Ng, RN
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
## 2020-12-23 ENCOUNTER — Telehealth: Payer: Self-pay

## 2020-12-23 NOTE — Telephone Encounter (Signed)
TC to patient to schedule next MH RV. Patient now scheduled for 01/15/2021, 4 weeks from last appointment. Interpreter Jacquenette Shone, [address] [employer], [place] #[id number].Marland KitchenBurt Knack, RN

## 2021-01-07 ENCOUNTER — Encounter: Payer: Self-pay | Admitting: Advanced Practice Midwife

## 2021-01-07 DIAGNOSIS — O0991 Supervision of high risk pregnancy, unspecified, first trimester: Secondary | ICD-10-CM

## 2021-01-15 ENCOUNTER — Telehealth: Payer: Self-pay

## 2021-01-15 ENCOUNTER — Ambulatory Visit: Payer: Self-pay

## 2021-01-15 NOTE — Telephone Encounter (Signed)
TC to patient to reschedule missed MH RV on 01/15/2021. LM with number to call. Interpreter V. [place].Burt Knack, RN

## 2021-01-21 ENCOUNTER — Other Ambulatory Visit: Payer: Self-pay

## 2021-01-21 ENCOUNTER — Ambulatory Visit: Payer: Self-pay | Admitting: Advanced Practice Midwife

## 2021-01-21 VITALS — BP 93/52 | HR 76 | Temp 97.4°F | Wt 152.8 lb

## 2021-01-21 DIAGNOSIS — O99011 Anemia complicating pregnancy, first trimester: Secondary | ICD-10-CM

## 2021-01-21 DIAGNOSIS — O0991 Supervision of high risk pregnancy, unspecified, first trimester: Secondary | ICD-10-CM

## 2021-01-21 LAB — HEMOGLOBIN, FINGERSTICK: Hemoglobin: 9.7 g/dL — ABNORMAL LOW (ref 11.1–15.9)

## 2021-01-21 MED ORDER — FERROUS SULFATE 325 (65 FE) MG PO TABS: 325.0000 mg | ORAL_TABLET | Freq: Two times a day (BID) | ORAL | 0 refills | Status: DC

## 2021-01-21 MED ORDER — IRON (FERROUS SULFATE) 325 (65 FE) MG PO TABS: 325.0000 mg | ORAL_TABLET | Freq: Two times a day (BID) | ORAL | 0 refills | Status: DC

## 2021-01-21 MED ORDER — FERROUS SULFATE 325 (65 FE) MG PO TABS: 325.0000 mg | ORAL_TABLET | Freq: Two times a day (BID) | ORAL | Status: DC

## 2021-01-21 NOTE — Progress Notes (Addendum)
Kept 01/19/21 [place] appt at [facility] and has follow-up appt 02/20/2021 at 1 pm per client. Per client, has talked with [facility] financial counselor regarding BTL cost. Verified client has [facility] contact card. Correctly verbalizes how to take PNV and iron tablet. Taking with a Vitamin C beverage. Jossie Ng, RN  Hgb = 9/7 and counseled to take iron BID. Additional iron dispensed to client and given Anemia in Pregnancy pamphlet as resource for iron rich foods. Jossie Ng, RN

## 2021-01-21 NOTE — Addendum Note (Signed)
Addended by: Jossie Ng on: 01/21/2021 12:02 PM   Modules accepted: Orders
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
## 2021-01-21 NOTE — Addendum Note (Signed)
Addended by: Jossie Ng on: 01/21/2021 12:21 PM   Modules accepted: Orders

## 2021-01-31 IMAGING — DX DG CHEST 1V PORT
1 series · 1 of 1 positions shown · non-contrast
Comparison: 03/28/2017

CLINICAL DATA: Productive cough for 1 week

EXAM:
PORTABLE CHEST 1 VIEW

[chest ap]
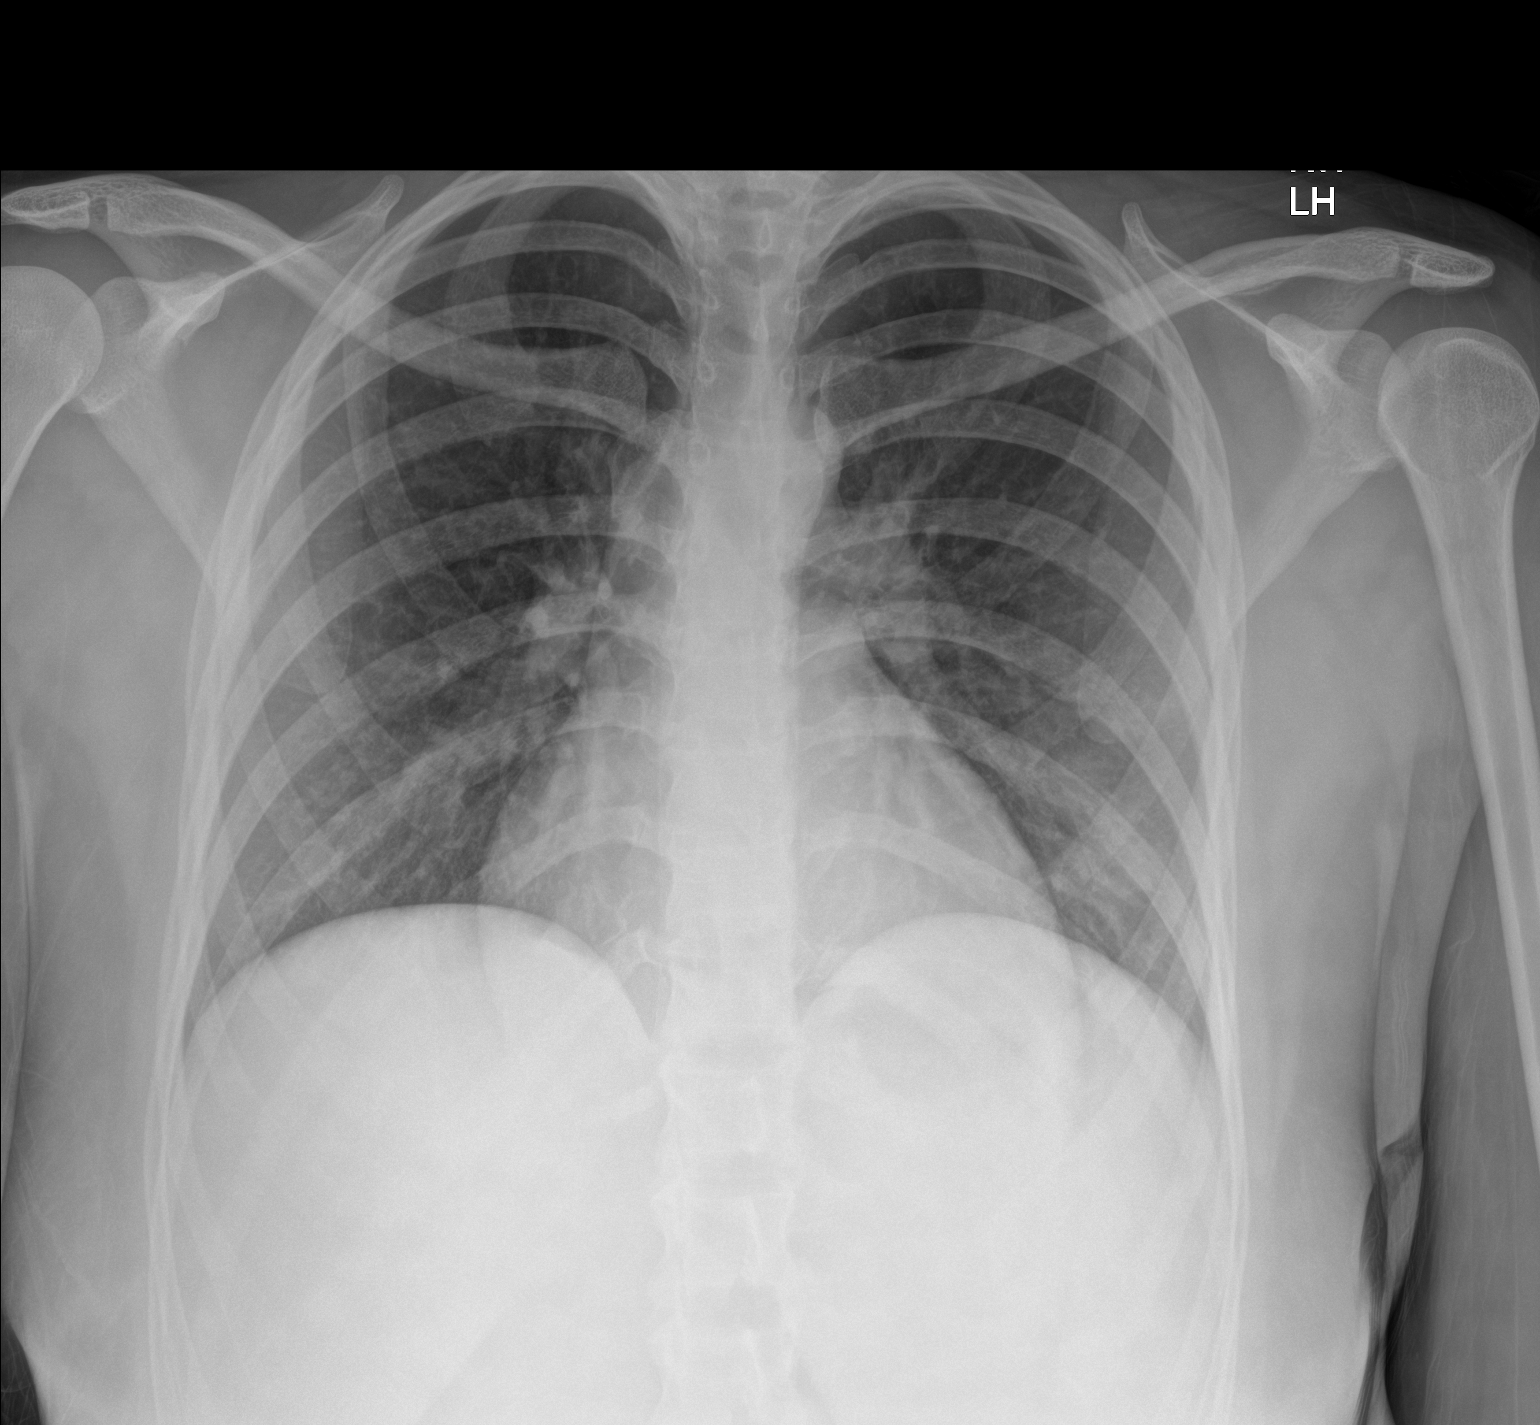

[1 of 1 positions shown; findings below may reference images not displayed]

FINDINGS: Normal heart size and mediastinal contours. No acute infiltrate or
edema. No effusion or pneumothorax. No acute osseous findings.
IMPRESSION: Negative chest.

## 2021-02-11 ENCOUNTER — Encounter: Payer: Self-pay | Admitting: Advanced Practice Midwife

## 2021-02-11 ENCOUNTER — Telehealth: Payer: Self-pay | Admitting: Family Medicine

## 2021-02-11 NOTE — Telephone Encounter (Signed)
Phone call to pt with language line interpreter.  Left message on voicemail that the requested letter is ready for pick up.

## 2021-02-11 NOTE — Telephone Encounter (Signed)
Phone call to pt with interpreter Marlene Yemen. Left message on voicemail that RN with [facility] is calling. The letter you requested is ready for pick up. [facility] to [facility] you normally go for [facility] appts.

## 2021-02-11 NOTE — Telephone Encounter (Signed)
Patient is here at the [facility] wanting a letter from provider stating she is having her prenatal care done here and that she has been a patient here since February 2018. Patient states she needs letter for Passport purposes and stated she needed it for today. Advice patient that it wouldn't been done today, but will call her when it is done and ready for pick up.

## 2021-02-12 NOTE — Telephone Encounter (Signed)
Pt to [facility] on 02/12/21 to pick up requested letter.

## 2021-02-18 ENCOUNTER — Ambulatory Visit: Payer: Self-pay | Admitting: Family Medicine

## 2021-02-18 ENCOUNTER — Other Ambulatory Visit: Payer: Self-pay

## 2021-02-18 VITALS — BP 93/57 | HR 85 | Temp 97.6°F | Wt 157.0 lb

## 2021-02-18 DIAGNOSIS — O0991 Supervision of high risk pregnancy, unspecified, first trimester: Secondary | ICD-10-CM

## 2021-02-18 DIAGNOSIS — O99019 Anemia complicating pregnancy, unspecified trimester: Secondary | ICD-10-CM

## 2021-02-18 DIAGNOSIS — O99011 Anemia complicating pregnancy, first trimester: Secondary | ICD-10-CM

## 2021-02-18 LAB — HEMOGLOBIN, FINGERSTICK: Hemoglobin: 10 g/dL — ABNORMAL LOW (ref 11.1–15.9)

## 2021-02-18 NOTE — Progress Notes (Signed)
Here today for 23.6 week MH RV. Taking PNV QD as well as Iron BID. Went to [facility] ED 02/05/2021 for per patient N/V. Per [facility] record patient left AMA at above ED visit. States "she is better now." Hgb recheck, PTL s/s info and CCNC and PHQ 9 forms today. Tawny Hopping, RN
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
## 2021-02-18 NOTE — Progress Notes (Signed)
Hgb 10.0. Instructed to continue taking Iron BID and separately from PNV. Recheck HGB at next RV. Tawny Hopping, RN

## 2021-03-04 ENCOUNTER — Encounter: Payer: Self-pay | Admitting: Family Medicine

## 2021-03-17 ENCOUNTER — Ambulatory Visit: Payer: Self-pay

## 2021-03-17 ENCOUNTER — Telehealth: Payer: Self-pay

## 2021-03-17 NOTE — Telephone Encounter (Signed)
DNKA as scheduled 03/17/2021 for MHC RV. Call to client and per recorded message, phone is not accepting calls at this time. Call to mother (emergency contact) at home number and left message requesting assistance contacting client to schedule missed MHC appt. Number to call provided. Roddie Mc Yemen interpreted during call. Jossie Ng, RN

## 2021-03-19 ENCOUNTER — Telehealth: Payer: Self-pay

## 2021-03-19 NOTE — Telephone Encounter (Signed)
Opened in error

## 2021-03-19 NOTE — Telephone Encounter (Signed)
Unable to LM on patient's phone due to continuous ringing interrupted by partial message. No answer. TC to emergency contact and LM to please have patient call us asap. Interpreter M. Yemen.Burt Knack, RN

## 2021-03-23 NOTE — Telephone Encounter (Signed)
Needs to reschedule missed MHC RV. Call to client with Marlene Yemen and per recorded message, phone is not accepting calls at this time. Call to emergency contact hone phone and left message requesting assistance contacting client to call MHC. Call to emergency cell phone and not available. Jossie Ng, RN

## 2021-03-24 NOTE — Telephone Encounter (Signed)
Call to client with Sabrina Pace to reschedule missed MHC RV appt. Per recorded message, phone is not accepting calls at this time. Call to emergency contact and left message requesting assistance contacting client to reschedule missed maternity appt and number to call provided. Jossie Ng, RN

## 2021-03-25 NOTE — Telephone Encounter (Signed)
Call to client with Sabrina Pace to schedule [facility] RV appt as did not keep last appt. Per recorded message, phone is not in service at this time. Call to mother (emergency contact) and left message on home and mobile number requesting assistance contacting client to schedule maternity clinic appt. Number to call provided on both messages. Jossie Ng, RN

## 2021-03-26 ENCOUNTER — Telehealth: Payer: Self-pay

## 2021-03-26 NOTE — Telephone Encounter (Signed)
Call to client to reschedule missed [facility] RV appt with [facility] [employer] ID # [phone]. Per recorded message, number is not in service. Call to emergency contact (mother) and left message on mobile and home phone  requesting assistance contacting client to schedule appt and number to call provided. Jossie Ng, RN

## 2021-03-26 NOTE — Telephone Encounter (Signed)
Opened in error. Sarahelizabeth Conway, RN  

## 2021-03-30 NOTE — Telephone Encounter (Signed)
Call to client with Sabrina Pace to schedule missed [facility] RV appt on 03/17/2021. Per recorded message, number is not available. Call to emergency contact (mother) mobile number and call will not go through. Call to mother's home number and left message requesting assistance contacting client to call health department to schedule maternity appt. Number to call provided. Jossie Ng, RN

## 2021-04-02 ENCOUNTER — Telehealth: Payer: Self-pay

## 2021-04-02 NOTE — Telephone Encounter (Signed)
Erroneous encounter as phone encounter previously opened. Jossie Ng, RN

## 2021-04-02 NOTE — Telephone Encounter (Signed)
Call to client to reschedule missed [facility] RV appt with Salli Real. Client's number not available per recorded message. Call to mother (emergency contact) and left message requesting assistance on home phone voicemail with number to call provided. Unsuccessful attempt to contact mother via mobile as call will not go through as dialed. Jossie Ng, RN

## 2021-04-03 NOTE — Telephone Encounter (Signed)
Call not made to client on 04/03/2021 as Burt Knack RN, [facility] Coordinator in process of sending letter to client as no response to multiple phone calls. Jossie Ng, RN

## 2021-04-06 NOTE — Telephone Encounter (Signed)
Certified letter sent to patient to encourage her to make a maternity appointment or to let us know if she has changed providers.Burt Knack, RN

## 2021-04-22 ENCOUNTER — Telehealth: Payer: Self-pay

## 2021-04-22 NOTE — Telephone Encounter (Signed)
TC to patient to try to schedule appointment for maternity care. Patient hasn't been seen since 02/18/2021 and still needs 28 week labs. Unable to LM as number is not in service. A certified letter was returned unopened, with sticker stating "no such street", "unable to forward". Per interpreter M. Yemen, patient walked in to clinic today with someone who was here for a pregnancy test. Per RN, L. Broadus John, patient was told to go the front windows and schedule an appointment. No appointment scheduled. TC to patient's emergency contact, and message left with phone number to call, to please have patient call for appointment and that it's very important. Interpreter, M. Yemen.Burt Knack, RN

## 2021-04-27 NOTE — Telephone Encounter (Signed)
Call to client in effort to schedule [facility] RV appt. Per recorded message, number is not accepting calls at this time. [employer] ID # [id number] used during call. However, call dropped and RN re-dialed [employer]. Interpreter ID # [id number] assisted with call to emergency contact (mother). Left message on home voicemail requesting assistance contacting client to schedule [facility] appt and number to call provided. Jossie Ng, RN

## 2021-04-28 NOTE — Telephone Encounter (Signed)
Call to client to schedule [facility] RV appt with [facility] Interpreter ID # [id number], but interpreter call dropped. Re-dialed [employer] and ID # [id number] assisted with call. Per interpreter, client cell phone not in service. Call to emergency contact and left message requesting assistance contacting client to schedule [facility] RV appt and number provided on cell and home phone. Jossie Ng, RN'

## 2021-04-29 NOTE — Telephone Encounter (Signed)
Call to client to schedule [facility] RV appt but patient's phone number is not in service. Call to emergency contact (mother) and left message in spanish requesting assistance contacting client to schedule [facility] RV appt and number provided on cell and home phone.   Floy Sabina, RN

## 2021-04-30 NOTE — Telephone Encounter (Signed)
Unable to leave voicemail on client's phone as call will not go through. Left message on both mobile and land line phone of mother requesting assistance contacting client to schedule appt in [facility] with number to call provided. Salli Real interpreted during call.Jossie Ng, RN

## 2021-05-04 NOTE — Telephone Encounter (Signed)
Phone call to pt at number on file, [phone]. Unable to leave message, voicemail did not pick up after ringing for approx 1 minute.

## 2021-05-05 NOTE — Telephone Encounter (Signed)
RN talked with client while at [facility] window today. Per client, feels depressed and doesn't like to leave the house. RN stated to client that she was in the building currently. Client scheduled appt for 05/06/2021. Artelia Laroche interpreted during visit. Jossie Ng, RN

## 2021-05-05 NOTE — Telephone Encounter (Signed)
Call to client to schedule [facility] RV appt and call will not go through as dialed. Call to emergency contact (mother) and left message requesting assistance contacting client to call [facility] on both cell and land line phones with number to call provided. Jossie Ng, RN

## 2021-05-06 ENCOUNTER — Ambulatory Visit: Payer: Self-pay

## 2021-05-07 ENCOUNTER — Ambulatory Visit: Payer: Self-pay

## 2021-05-15 ENCOUNTER — Telehealth: Payer: Self-pay

## 2021-05-15 NOTE — Telephone Encounter (Signed)
Spoke with patient as she is emergency contact to another patient and went ahead and scheduled for her MH RV.   Patient states is will definitely come to appointment. Patient aware to call us beforehand if she cannot make it.    Floy Sabina, RN

## 2021-05-18 ENCOUNTER — Ambulatory Visit: Payer: Self-pay | Admitting: Advanced Practice Midwife

## 2021-05-18 ENCOUNTER — Other Ambulatory Visit: Payer: Self-pay

## 2021-05-18 DIAGNOSIS — F4325 Adjustment disorder with mixed disturbance of emotions and conduct: Secondary | ICD-10-CM

## 2021-05-18 DIAGNOSIS — O99019 Anemia complicating pregnancy, unspecified trimester: Secondary | ICD-10-CM

## 2021-05-18 DIAGNOSIS — Z91199 Patient's noncompliance with other medical treatment and regimen due to unspecified reason: Secondary | ICD-10-CM | POA: Insufficient documentation

## 2021-05-18 DIAGNOSIS — Z23 Encounter for immunization: Secondary | ICD-10-CM

## 2021-05-18 DIAGNOSIS — O09893 Supervision of other high risk pregnancies, third trimester: Secondary | ICD-10-CM

## 2021-05-18 DIAGNOSIS — Z62810 Personal history of physical and sexual abuse in childhood: Secondary | ICD-10-CM

## 2021-05-18 DIAGNOSIS — O0991 Supervision of high risk pregnancy, unspecified, first trimester: Secondary | ICD-10-CM

## 2021-05-18 DIAGNOSIS — O99013 Anemia complicating pregnancy, third trimester: Secondary | ICD-10-CM

## 2021-05-18 DIAGNOSIS — O0993 Supervision of high risk pregnancy, unspecified, third trimester: Secondary | ICD-10-CM

## 2021-05-18 DIAGNOSIS — T1491XA Suicide attempt, initial encounter: Secondary | ICD-10-CM

## 2021-05-18 LAB — HEMOGLOBIN, FINGERSTICK: Hemoglobin: 8.5 g/dL — ABNORMAL LOW (ref 11.1–15.9)

## 2021-05-18 MED ORDER — IRON (FERROUS SULFATE) 325 (65 FE) MG PO TABS: 1.0000 | ORAL_TABLET | Freq: Two times a day (BID) | ORAL | 0 refills | Status: DC

## 2021-05-18 NOTE — Progress Notes (Signed)
Patient Hgb 8.5 today, given more FeSO4 and counseled to take 3x/daily with vitamin C juice. Patient states understanding. Tdap given today, tolerated well, VIS given. NCIR in TEFL teacher. Patient counseled to expect call from [facility] for hematology appt. Referral faxed. BTL info booklet given today and [facility] financial numbers as well. Patient called and left message while in clinic, she also states she applied for financial aid at [facility] Patient address updated and phone verified. Patient counseled that it's very important to come to her now weekly appointments. Next appt on 05/26/2021. 28 week lab, 36 week labs and kick counts done today. [facility] contact card given.Burt Knack, RN

## 2021-05-18 NOTE — Progress Notes (Signed)
Patient here for MH RV at 36 4/7. Patient hasn't been seen for 12+ weeks. Address updated and phone # verified. 28 week labs, 36 week labs, Kick counts today. 1 hour gtt and Tdap also. [facility] pager card given and explained to patient. Patient desires BTL, pamphlet and [facility] financial phone # given. Patient states she applied for financial help at  [employer]. Burt Knack, RN
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
## 2021-05-18 NOTE — Addendum Note (Signed)
Addended by: Burt Knack on: 05/18/2021 06:34 PM   Modules accepted: Orders

## 2021-05-19 ENCOUNTER — Telehealth: Payer: Self-pay

## 2021-05-19 NOTE — Telephone Encounter (Signed)
Called [facility] Family Medicine Team to help coordinate Fe infusions. Resident and attending took the information and will help schedule iron infusion. They were going to call back to provider room for confirmation but as of 05/19/21 at 11:56 AM I have not spoken to them. Can you check [facility] CareLink tomorrow to see if appointment was scheduled.   I gave them the patient preference of 6/29 or 6/30 in the afternoon.

## 2021-05-19 NOTE — Telephone Encounter (Signed)
Received a call from [facility] iron transfusion scheduler and due to the need for patient to be seen ASAP, scheduler informed RN that patient needs to be seen at [facility] and Delivery and have an appointment set up there for iron transfusion.   Will contact patient to see which days will be best for her regarding the iron transfusion.   Patient contacted and states she can arrive to [facility] L&D 6/29 in the afternoon or 6/30 in the afternoon.   Provider, K. Alvester Morin, MD made aware.   Floy Sabina, RN

## 2021-05-20 LAB — HIV-1/HIV-2 QUALITATIVE RNA
HIV-1 RNA, Qualitative: NONREACTIVE
HIV-2 RNA, Qualitative: NONREACTIVE

## 2021-05-20 LAB — GLUCOSE, 1 HOUR GESTATIONAL: Gestational Diabetes Screen: 92 mg/dL (ref 65–139)

## 2021-05-20 LAB — RPR: RPR Ser Ql: NONREACTIVE

## 2021-05-20 NOTE — Telephone Encounter (Signed)
Checked care link this morning for iron appointment and did not see one. Will f/u today.   Floy Sabina, RN

## 2021-05-21 ENCOUNTER — Telehealth: Payer: Self-pay

## 2021-05-21 LAB — CHLAMYDIA/GC NAA, CONFIRMATION
Chlamydia trachomatis, NAA: NEGATIVE
Neisseria gonorrhoeae, NAA: NEGATIVE

## 2021-05-21 NOTE — Telephone Encounter (Signed)
Attempted to contact patient, no answer and left VM in spanish.   Message was regarding [facility] attempting to contact patient to make her an appointment for IV iron infusion as soon as possible. Per [facility], they are unable to reach patient. Left message to patient to please return their call and make an appointment for infusion.   Floy Sabina, RN

## 2021-05-22 LAB — CULTURE, BETA STREP (GROUP B ONLY): Strep Gp B Culture: NEGATIVE

## 2021-05-22 NOTE — Telephone Encounter (Signed)
TC from [name], [facility], stating she talked with patient this morning and gave her the number for [facility] MFM and counseled her that it was very important for her to call the number for an appointment. Per [name], patient states she will call today. Also, this patient gave [name] a new number for patient Sabrina Pace, [phone] (will add to patient chart). Per Rennis Golden states that she and Princess Bruins are aware of amniocentesis appt. And will go to the appt.  TC to [facility] MFM at [facility], and per scheduling they have her referral but the triage RN has not yet reviewed the referral and notes, so no appointment pending at this time. Per [facility] MFM scheduling they will alert the RN reviewer about urgency due to patient gestational age.Burt Knack, RN

## 2021-05-22 NOTE — Telephone Encounter (Signed)
Attempted to contact patient, no answer and left VM in spanish. Contacted yesterday with no answer as well.   Attempted to contact EC - sister Radene Journey but no answer as well and phone call sounded like it answered and hung up.    Message was regarding [facility] attempting to contact patient to make her an appointment for IV iron infusion as soon as possible. Per [facility], they are unable to reach patient. Left message to patient to please return their call and make an appointment for infusion.      Floy Sabina, RN

## 2021-05-26 ENCOUNTER — Ambulatory Visit: Payer: Self-pay

## 2021-05-29 ENCOUNTER — Telehealth: Payer: Self-pay

## 2021-05-29 NOTE — Telephone Encounter (Signed)
1.Call to client as has no future [facility] RV appt scheduled.   2.[facility] referral ordered 05/18/2021 (hgb 8.5 / need for iron infusion) and faxed same day with confirmation received. Per [employer], scheduler unable to contact client.  RN attempted to contact client via phone call 05/29/2021 but no answer / no voicemail. Call to emergency contact (sister) and left message requesting assistance contacting client to call  [facility] regarding appts. [employer]preter ID #  [id number] assisted with call. Jossie Ng, RN

## 2021-06-04 NOTE — Telephone Encounter (Signed)
Call to client with Sabrina Pace as interpreter. Left message on client's voicemail requesting she [facility] to schedule clinic appt and to discuss needed appt at [facility]. Number to call provided. Jossie Ng, RN

## 2021-06-04 NOTE — Progress Notes (Signed)
Client is a gravida 6 at 80 0/7 today. Client just walked into [facility] stating to hospital yesterday and sent home [facility] - no active labor, reactive NST per Epic).  Reports contractions are more frequent, more painful and feels like leaking liquid. Client counseled to return to hospital for evaluation. ARobbie Lis PA-C notified of above. Jossie Ng, RN

## 2021-06-05 NOTE — Telephone Encounter (Signed)
Client delivered 06/05/2021 at [facility]. Jossie Ng, RN

## 2021-06-09 NOTE — Addendum Note (Signed)
Addended by: Heywood Bene on: 06/09/2021 09:09 AM   Modules accepted: Orders

## 2021-06-11 ENCOUNTER — Encounter: Payer: Self-pay | Admitting: Advanced Practice Midwife

## 2021-08-12 ENCOUNTER — Telehealth: Payer: Self-pay

## 2021-08-12 NOTE — Telephone Encounter (Signed)
TC to patient to schedule PP visit. Patient phone not in service. TC to patient sister, person who answered states doesn't know patient (English speaking). TC to mother's home phone number [phone] and LM with number to call. Interpreter M. [place].Burt Knack, RN

## 2021-08-19 NOTE — Telephone Encounter (Signed)
Call to client to schedule post-partum appt and per recorded message, number is not in service. Call to emergency contact and message left requesting assistance contacting client to call [facility] for posp-partum appt. Jossie Ng, RN

## 2021-09-09 NOTE — Telephone Encounter (Signed)
Call to client to schedule post-partum appt and number remains out of service. Call to emergency contact and per female answering phone, she does not know client. Jossie Ng, RN

## 2021-09-10 NOTE — Telephone Encounter (Signed)
Phone encounter closed. Jordynn Perrier, RN  

## 2022-03-21 ENCOUNTER — Emergency Department
Admission: EM | Admit: 2022-03-21 | Discharge: 2022-03-21 | Disposition: A | Discharge status: Home / Self Care | Payer: Self-pay | Attending: Emergency Medicine | Admitting: Emergency Medicine

## 2022-03-21 ENCOUNTER — Other Ambulatory Visit: Payer: Self-pay

## 2022-03-21 ENCOUNTER — Emergency Department: Payer: Self-pay

## 2022-03-21 DIAGNOSIS — Z3A08 8 weeks gestation of pregnancy: Secondary | ICD-10-CM | POA: Insufficient documentation

## 2022-03-21 DIAGNOSIS — O200 Threatened abortion: Secondary | ICD-10-CM | POA: Insufficient documentation

## 2022-03-21 DIAGNOSIS — Z332 Encounter for elective termination of pregnancy: Secondary | ICD-10-CM

## 2022-03-21 DIAGNOSIS — N939 Abnormal uterine and vaginal bleeding, unspecified: Secondary | ICD-10-CM

## 2022-03-21 LAB — CBC WITH DIFFERENTIAL/PLATELET
Abs Immature Granulocytes: 0.06 10*3/uL (ref 0.00–0.07)
Basophils Absolute: 0 10*3/uL (ref 0.0–0.1)
Basophils Relative: 0 %
Eosinophils Absolute: 0.1 10*3/uL (ref 0.0–0.5)
Eosinophils Relative: 1 %
HCT: 30.6 % — ABNORMAL LOW (ref 36.0–46.0)
Hemoglobin: 10 g/dL — ABNORMAL LOW (ref 12.0–15.0)
Immature Granulocytes: 1 %
Lymphocytes Relative: 22 %
Lymphs Abs: 2.4 10*3/uL (ref 0.7–4.0)
MCH: 28.6 pg (ref 26.0–34.0)
MCHC: 32.7 g/dL (ref 30.0–36.0)
MCV: 87.4 fL (ref 80.0–100.0)
Monocytes Absolute: 0.6 10*3/uL (ref 0.1–1.0)
Monocytes Relative: 6 %
Neutro Abs: 7.6 10*3/uL (ref 1.7–7.7)
Neutrophils Relative %: 70 %
Platelets: 288 10*3/uL (ref 150–400)
RBC: 3.5 MIL/uL — ABNORMAL LOW (ref 3.87–5.11)
RDW: 14.3 % (ref 11.5–15.5)
WBC: 10.9 10*3/uL — ABNORMAL HIGH (ref 4.0–10.5)
nRBC: 0 % (ref 0.0–0.2)

## 2022-03-21 LAB — BASIC METABOLIC PANEL
Anion gap: 8 (ref 5–15)
BUN: 13 mg/dL (ref 6–20)
CO2: 23 mmol/L (ref 22–32)
Calcium: 8.7 mg/dL — ABNORMAL LOW (ref 8.9–10.3)
Chloride: 105 mmol/L (ref 98–111)
Creatinine, Ser: 0.46 mg/dL (ref 0.44–1.00)
GFR, Estimated: >60 mL/min (ref >60)
Glucose, Bld: 122 mg/dL — ABNORMAL HIGH (ref 70–99)
Potassium: 3.7 mmol/L (ref 3.5–5.1)
Sodium: 136 mmol/L (ref 135–145)

## 2022-03-21 LAB — PROTIME-INR
INR: 1.1 (ref 0.8–1.2)
Prothrombin Time: 14.3 seconds (ref 11.4–15.2)

## 2022-03-21 LAB — HCG, QUANTITATIVE, PREGNANCY: hCG, Beta Chain, Quant, S: 61461 m[IU]/mL — ABNORMAL HIGH (ref <5)

## 2022-03-21 MED ORDER — ONDANSETRON HCL 4 MG/2ML IJ SOLN
4.0000 mg | Freq: Once | INTRAMUSCULAR | Status: AC
  Administered 2022-03-21: 4 mg via INTRAVENOUS
  Filled 2022-03-21: qty 2

## 2022-03-21 MED ORDER — LACTATED RINGERS IV BOLUS
1000.0000 mL | Freq: Once | INTRAVENOUS | Status: AC
  Administered 2022-03-21: 1000 mL via INTRAVENOUS

## 2022-03-21 MED ORDER — METHYLERGONOVINE MALEATE 0.2 MG/ML IJ SOLN
0.2000 mg | Freq: Once | INTRAMUSCULAR | Status: AC
Start: 2022-03-21 — End: 2022-03-21
  Administered 2022-03-21: 0.2 mg via INTRAMUSCULAR
  Filled 2022-03-21: qty 1

## 2022-03-21 MED ORDER — KETOROLAC TROMETHAMINE 15 MG/ML IJ SOLN
15.0000 mg | Freq: Once | INTRAMUSCULAR | Status: AC
Start: 2022-03-21 — End: 2022-03-21
  Administered 2022-03-21: 15 mg via INTRAVENOUS
  Filled 2022-03-21: qty 1

## 2022-03-21 MED ORDER — NORGESTIMATE-ETH ESTRADIOL 0.25-35 MG-MCG PO TABS: 1.0000 | ORAL_TABLET | Freq: Every day | ORAL | 1 refills | Status: DC

## 2022-03-21 NOTE — ED Notes (Signed)
Verbal discharge consent obtained. E sig pad not working. ?

## 2022-03-21 NOTE — ED Provider Notes (Addendum)
Ultrasound not suggestive of ectopic.  Pelvic exam performed with RN chaperone, [place], she has evidence of open cervix with placental tissue in the cervical os which was removed.  Mild amount of bleeding which after removal of the placental tissue in the cervix has abated.  Abdominal exam benign.  Presentation consistent with incomplete AB.  OB/GYN consulted and recommended dose of methergine to help with cramping and bleeding.  Patient does appear stable and appropriate for close outpatient follow-up.  Demonstrates understanding of signs and symptoms for which she should return to the ER. ?  ? ?  ?Willy Eddy, MD ?03/21/22 0932 ? ?

## 2022-03-21 NOTE — ED Triage Notes (Signed)
Pt presents to ER c/o severe lower abd pain and vaginal bleeding that started around 2.5 hours ago.  Pt endorses passing large blood clots since bleeding started.  Pt states she had abortion on Friday.  Pt endorses nausea but denies vomiting at this time.  Pt states she has gone through 8 pads since bleeding started.  Pt A&O x4 at this time and appears very uncomfortable.   ?

## 2022-03-21 NOTE — ED Notes (Signed)
Pelvic exam was performed at bedside by EDP. Minimal EBL noted at this time. Small piece of placental tissue was removed from cervix. Pt states that similar-looking tissue had been expelled at home. Pt states that uterine cramping has significantly diminished. ?

## 2022-03-21 NOTE — ED Provider Notes (Signed)
? ?[facility] ?Provider Note ? ? ? Event Date/Time  ? First MD Initiated Contact with Patient 03/21/22 0544   ?  (approximate) ? ? ?History  ? ?Abdominal Pain and Vaginal Bleeding ? ? ?HPI ? ?Sabrina Pace is a 26 y.o. female  who presents for evaluation of cramping and vaginal bleeding.  Patient reports that she was [redacted] weeks pregnant and last week underwent a medical abortion.  Patient reports that she was given 4 misoprostol pills that she placed in her vagina 4 days ago. She started bleeding yesterday. About 3 hours ago, the bleeding got more severe and she is passing large clots. She is also complaining of severe suprapubic cramping.  No chest pain, no syncope, no shortness of breath.  She is not on blood thinners. ?  ? ? ?Past Medical History:  ?Diagnosis Date  ? Anemia   ? Hx of preeclampsia, prior pregnancy, currently pregnant 05/22/2017  ? 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 Kell antibody Update:  09/22/2018; Ms. Solmayra Gillan had amniocentesis and results indicate normal karyotype and fetus is Kell antigen negative, therefore NOT at risk for fetal anemia.  No further indication for MCA Doppler/anemia screening.  ? Medical history non-contributory   ? Migraines   ? ? ?Past Surgical History:  ?Procedure Laterality Date  ? APPENDECTOMY    ? ? ? ?Physical Exam  ? ?Triage Vital Signs: ?ED Triage Vitals  ?Enc Vitals Group  ?   BP 03/21/22 0550 (!) 95/57  ?   Pulse Rate 03/21/22 0550 70  ?   Resp 03/21/22 0550 (!) 22  ?   Temp 03/21/22 0550 97.7 ?F (36.5 ?C)  ?   Temp Source 03/21/22 0550 Oral  ?   SpO2 03/21/22 0550 98 %  ?   Weight 03/21/22 0547 160 lb (72.6 kg)  ?   Height 03/21/22 0547 5\' 2"  (1.575 m)  ?   Head Circumference --   ?   Peak Flow --   ?   Pain Score 03/21/22 0547 9  ?   Pain Loc --   ?   Pain Edu? --   ?   Excl. in [place]? --   ? ? ?Most recent vital signs: ?Vitals:  ? 03/21/22 0645 03/21/22 0700  ?BP:  110/64  ?Pulse: 62  61  ?Resp: 16 17  ?Temp:    ?SpO2: 100% 100%  ? ? ? ?Constitutional: Alert and oriented. Well appearing and in no apparent distress. ?HEENT: ?     Head: Normocephalic and atraumatic.    ?     Eyes: Conjunctivae are normal. Sclera is non-icteric.  ?     Mouth/Throat: Mucous membranes are moist.  ?     Neck: Supple with no signs of meningismus. ?Cardiovascular: Regular rate and rhythm. No murmurs, gallops, or rubs. 2+ symmetrical distal pulses are present in all extremities.  ?Respiratory: Normal respiratory effort. Lungs are clear to auscultation bilaterally.  ?Gastrointestinal: Soft, mild suprapubic tenderness, and non distended with positive bowel sounds. No rebound or guarding. ?Genitourinary: No CVA tenderness. ?Musculoskeletal:  No edema, cyanosis, or erythema of extremities. ?Neurologic: Normal speech and language. Face is symmetric. Moving all extremities. No gross focal neurologic deficits are appreciated. ?Skin: Skin is warm, dry and intact. No rash noted. ?Psychiatric: Mood and affect are normal. Speech and behavior are normal. ? ?ED Results / Procedures / Treatments  ? ?Labs ?(all labs ordered are listed, but only abnormal results  are displayed) ?Labs Reviewed  ?CBC WITH DIFFERENTIAL/PLATELET - Abnormal; Notable for the following components:  ?    Result Value  ? WBC 10.9 (*)   ? RBC 3.50 (*)   ? Hemoglobin 10.0 (*)   ? HCT 30.6 (*)   ? All other components within normal limits  ?BASIC METABOLIC PANEL - Abnormal; Notable for the following components:  ? Glucose, Bld 122 (*)   ? Calcium 8.7 (*)   ? All other components within normal limits  ?PROTIME-INR  ?HCG, QUANTITATIVE, PREGNANCY  ?TYPE AND SCREEN  ? ? ? ?EKG ? ?none ? ? ?RADIOLOGY ?TVUS: PND ? ? ?PROCEDURES: ? ?Critical Care performed: No ? ?Procedures ? ? ? ?IMPRESSION / MDM / ASSESSMENT AND PLAN / ED COURSE  ?I reviewed the triage vital signs and the nursing notes. ? ?26 y.o. female  who presents for evaluation of cramping and vaginal bleeding after  using misoprostol 4 days ago for medical abortion at [redacted] weeks GA. patient is hemodynamically stable although with slightly low blood pressure of 95/57.  Abdomen is soft with mild suprapubic tenderness.  Presentation is concerning for ongoing abortion.  We will check labs to rule out acute blood loss anemia requiring blood transfusion.  We will get an ultrasound.  We will give IV Toradol, Zofran and fluids for symptom relief. ? ? ?MEDICATIONS GIVEN IN ED: ?Medications  ?lactated ringers bolus 1,000 mL (1,000 mLs Intravenous New Bag/Given 03/21/22 0618)  ?ketorolac (TORADOL) 15 MG/ML injection 15 mg (15 mg Intravenous Given 03/21/22 0619)  ?ondansetron [facility]) injection 4 mg (4 mg Intravenous Given 03/21/22 0617)  ? ? ? ?ED COURSE: BP improved without any intervention.  Hemoglobin stable at 10 when compared to prior labs.  Normal INR.  Normal metabolic panel.  hCG, type and screen, and transvaginal ultrasound pending.  Care transferred to Dr. Quentin Cornwall ? ? ?Consults: none ? ? ?EMR reviewed including records from last visit to her OB from July 2022 ? ? ? ?FINAL CLINICAL IMPRESSION(S) / ED DIAGNOSES  ? ?Final diagnoses:  ?Vaginal bleeding  ?Medical abortion  ? ? ? ?Rx / DC Orders  ? ?ED Discharge Orders   ? ? None  ? ?  ? ? ? ?Note:  This document was prepared using Dragon voice recognition software and may include unintentional dictation errors. ? ? ?Please note:  Patient was evaluated in Emergency Department today for the symptoms described in the history of present illness. Patient was evaluated in the context of the global COVID-19 pandemic, which necessitated consideration that the patient might be at risk for infection with the SARS-CoV-2 virus that causes COVID-19. Institutional protocols and algorithms that pertain to the evaluation of patients at risk for COVID-19 are in a state of rapid change based on information released by regulatory bodies including the CDC and federal and state organizations. These policies  and algorithms were followed during the patient's care in the ED.  Some ED evaluations and interventions may be delayed as a result of limited staffing during the pandemic. ? ? ? ? ?  ?Rudene Re, MD ?03/21/22 2307 ? ?

## 2022-03-23 LAB — TYPE AND SCREEN
ABO/RH(D): A POS
Antibody Screen: POSITIVE
Unit division: 0
Unit division: 0

## 2022-03-23 LAB — BPAM RBC
Blood Product Expiration Date: 202305052359
Blood Product Expiration Date: 202305262359
Unit Type and Rh: 5100
Unit Type and Rh: 6200

## 2022-05-26 ENCOUNTER — Ambulatory Visit: Payer: Self-pay

## 2023-01-05 ENCOUNTER — Encounter: Payer: Self-pay | Admitting: Emergency Medicine

## 2023-01-05 DIAGNOSIS — O26891 Other specified pregnancy related conditions, first trimester: Secondary | ICD-10-CM | POA: Insufficient documentation

## 2023-01-05 DIAGNOSIS — Z7982 Long term (current) use of aspirin: Secondary | ICD-10-CM | POA: Insufficient documentation

## 2023-01-05 DIAGNOSIS — R519 Headache, unspecified: Secondary | ICD-10-CM | POA: Insufficient documentation

## 2023-01-05 DIAGNOSIS — H1131 Conjunctival hemorrhage, right eye: Secondary | ICD-10-CM | POA: Insufficient documentation

## 2023-01-05 NOTE — ED Triage Notes (Signed)
Pt c/o HA and right eye redness with broken blood vessels x3 days. Pt reports this happened 15 days ago as well. Pt denies seeking medical treatment and that it went away. Pt repots taking Excedrin without relief. Hx/o Migraines.

## 2023-01-06 ENCOUNTER — Emergency Department
Admission: EM | Admit: 2023-01-06 | Discharge: 2023-01-06 | Disposition: A | Discharge status: Home / Self Care | Payer: Self-pay | Attending: Emergency Medicine | Admitting: Emergency Medicine

## 2023-01-06 DIAGNOSIS — Z3491 Encounter for supervision of normal pregnancy, unspecified, first trimester: Secondary | ICD-10-CM

## 2023-01-06 DIAGNOSIS — R519 Headache, unspecified: Secondary | ICD-10-CM

## 2023-01-06 DIAGNOSIS — H1131 Conjunctival hemorrhage, right eye: Secondary | ICD-10-CM

## 2023-01-06 LAB — CBC WITH DIFFERENTIAL/PLATELET
Abs Immature Granulocytes: 0.03 10*3/uL (ref 0.00–0.07)
Basophils Absolute: 0 10*3/uL (ref 0.0–0.1)
Basophils Relative: 0 %
Eosinophils Absolute: 0.1 10*3/uL (ref 0.0–0.5)
Eosinophils Relative: 1 %
HCT: 32.5 % — ABNORMAL LOW (ref 36.0–46.0)
Hemoglobin: 10.6 g/dL — ABNORMAL LOW (ref 12.0–15.0)
Immature Granulocytes: 0 %
Lymphocytes Relative: 29 %
Lymphs Abs: 3.2 10*3/uL (ref 0.7–4.0)
MCH: 26.1 pg (ref 26.0–34.0)
MCHC: 32.6 g/dL (ref 30.0–36.0)
MCV: 80 fL (ref 80.0–100.0)
Monocytes Absolute: 0.7 10*3/uL (ref 0.1–1.0)
Monocytes Relative: 6 %
Neutro Abs: 7.1 10*3/uL (ref 1.7–7.7)
Neutrophils Relative %: 64 %
Platelets: 367 10*3/uL (ref 150–400)
RBC: 4.06 MIL/uL (ref 3.87–5.11)
RDW: 16.4 % — ABNORMAL HIGH (ref 11.5–15.5)
WBC: 11.1 10*3/uL — ABNORMAL HIGH (ref 4.0–10.5)
nRBC: 0 % (ref 0.0–0.2)

## 2023-01-06 LAB — URINALYSIS, ROUTINE W REFLEX MICROSCOPIC
Bilirubin Urine: NEGATIVE
Glucose, UA: NEGATIVE mg/dL
Hgb urine dipstick: NEGATIVE
Ketones, ur: 5 mg/dL — AB
Leukocytes,Ua: NEGATIVE
Nitrite: NEGATIVE
Protein, ur: NEGATIVE mg/dL
Specific Gravity, Urine: 1.026 (ref 1.005–1.030)
pH: 6 (ref 5.0–8.0)

## 2023-01-06 LAB — POC URINE PREG, ED: Preg Test, Ur: POSITIVE — AB

## 2023-01-06 LAB — COMPREHENSIVE METABOLIC PANEL
ALT: 13 U/L (ref 0–44)
AST: 13 U/L — ABNORMAL LOW (ref 15–41)
Albumin: 3.9 g/dL (ref 3.5–5.0)
Alkaline Phosphatase: 70 U/L (ref 38–126)
Anion gap: 9 (ref 5–15)
BUN: 11 mg/dL (ref 6–20)
CO2: 24 mmol/L (ref 22–32)
Calcium: 9 mg/dL (ref 8.9–10.3)
Chloride: 102 mmol/L (ref 98–111)
Creatinine, Ser: 0.75 mg/dL (ref 0.44–1.00)
GFR, Estimated: >60 mL/min (ref >60)
Glucose, Bld: 105 mg/dL — ABNORMAL HIGH (ref 70–99)
Potassium: 3.8 mmol/L (ref 3.5–5.1)
Sodium: 135 mmol/L (ref 135–145)
Total Bilirubin: 0.4 mg/dL (ref 0.3–1.2)
Total Protein: 7.5 g/dL (ref 6.5–8.1)

## 2023-01-06 LAB — HCG, QUANTITATIVE, PREGNANCY: hCG, Beta Chain, Quant, S: 77845 m[IU]/mL — ABNORMAL HIGH (ref <5)

## 2023-01-06 MED ORDER — ACETAMINOPHEN 500 MG PO TABS
1000.0000 mg | ORAL_TABLET | Freq: Once | ORAL | Status: AC
  Administered 2023-01-06: 1000 mg via ORAL
  Filled 2023-01-06: qty 2

## 2023-01-06 MED ORDER — SODIUM CHLORIDE 0.9 % IV BOLUS (SEPSIS)
1000.0000 mL | Freq: Once | INTRAVENOUS | Status: AC
  Administered 2023-01-06: 1000 mL via INTRAVENOUS

## 2023-01-06 MED ORDER — DIPHENHYDRAMINE HCL 50 MG/ML IJ SOLN
25.0000 mg | Freq: Once | INTRAMUSCULAR | Status: AC
  Administered 2023-01-06: 25 mg via INTRAVENOUS
  Filled 2023-01-06: qty 1

## 2023-01-06 MED ORDER — METOCLOPRAMIDE HCL 5 MG/ML IJ SOLN
10.0000 mg | Freq: Once | INTRAMUSCULAR | Status: AC
  Administered 2023-01-06: 10 mg via INTRAVENOUS
  Filled 2023-01-06: qty 2

## 2023-01-06 NOTE — Discharge Instructions (Signed)
I recommend that you start taking a prenatal vitamin daily.  You may use over-the-counter Tylenol 1000 g every 6 hours as needed for pain.   Te recomiendo que empieces a tomar una vitamina prenatal diariamente.  Puede usar Tylenol de venta libre, 1000 g cada 6 horas, segn sea necesario para el dolor.

## 2023-01-06 NOTE — ED Notes (Signed)
VISUAL ACUITY:   RIGHT 20/30 LEFT 20/25

## 2023-01-06 NOTE — ED Provider Notes (Signed)
[facility] Provider Note    Event Date/Time   First MD Initiated Contact with Patient 01/06/23 0128     (approximate)   History   Headache   HPI  Sabrina Pace is a 27 y.o. female  G6, P5 with history of migraine headaches who presents to the emergency department with headache for the past several days.  Also has a right subconjunctival hemorrhage but no vision changes.  No numbness, tingling or weakness.  No fevers.  No head injury.  Last menstrual period was December 31.  Pregnancy test positive here.  She was not aware she was pregnant.   She denies any abdominal pain, vaginal bleeding, leaking fluid, urinary symptoms.  Patient 6 weeks, 4 days based on LMP of 11/21/22.   History provided by patient using interpretor.    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 Nedra Stilp had amniocentesis and results indicate normal karyotype and fetus is Kell antigen negative, therefore NOT at risk for fetal anemia.  No further indication for MCA Doppler/anemia screening.   Medical history non-contributory    Migraines     Past Surgical History:  Procedure Laterality Date   APPENDECTOMY      MEDICATIONS:  Prior to Admission medications   Medication Sig Start Date End Date Taking? Authorizing Provider  acetaminophen (TYLENOL) 325 MG tablet Take 2 tablets (650 mg total) by mouth every 4 (four) hours as needed for mild pain or moderate pain. Patient not taking: No sig reported 01/04/19   Lisette Grinder, CNM  aspirin-acetaminophen-caffeine (EXCEDRIN MIGRAINE) [phone] MG tablet Take by mouth every 6 (six) hours as needed for headache. Patient not taking: No sig reported    [provider]  brompheniramine-pseudoephedrine-DM 30-2-10 MG/5ML  syrup Take 5 mLs by mouth 4 (four) times daily as needed. Patient not taking: No sig reported 01/03/20   Sable Feil, PA-C  ibuprofen (ADVIL) 600 MG tablet Take 1 tablet (600 mg total) by mouth every 8 (eight) hours as needed. Patient not taking: No sig reported 01/03/20   Sable Feil, PA-C  ibuprofen (ADVIL,MOTRIN) 600 MG tablet Take 1 tablet (600 mg total) by mouth every 6 (six) hours as needed for mild pain, moderate pain or cramping. Patient not taking: No sig reported 01/04/19   Lisette Grinder, CNM  Iron, Ferrous Sulfate, 325 (65 Fe) MG TABS Take 1 tablet by mouth 2 (two) times daily. 05/18/21 07/07/21  Sciora, Real Cons, CNM  norgestimate-ethinyl estradiol (ORTHO-CYCLEN) 0.25-35 MG-MCG tablet Take 1 tablet by mouth daily. 03/21/22   Merlyn Lot, MD  Prenatal Vit-Fe Fumarate-FA (MULTIVITAMIN-PRENATAL) 27-0.8 MG TABS tablet Take 1 tablet by mouth daily at 12 noon.    [provider]  promethazine (PHENERGAN) 25 MG tablet Take 1 tablet (25 mg total) by mouth every 6 (six) hours as needed for nausea or vomiting. Patient not taking: No sig reported 11/20/20   Lora Havens, PA-C  vitamin C (VITAMIN C) 500 MG tablet Take 1 tablet (500 mg total) by mouth 2 (two) times daily with a meal. Patient not taking: Reported on 05/18/2021 01/04/19   Lisette Grinder, CNM    Physical Exam   Triage Vital Signs: ED Triage Vitals  Enc Vitals Group     BP 01/05/23 2337 (!) 120/52     Pulse  Rate 01/05/23 2337 72     Resp 01/05/23 2337 16     Temp 01/05/23 2337 98 F (36.7 C)     Temp Source 01/05/23 2337 Oral     SpO2 01/05/23 2337 100 %     Weight 01/05/23 2342 143 lb (64.9 kg)     Height 01/05/23 2342 5' 2"$  (1.575 m)     Head Circumference --      Peak Flow --      Pain Score 01/05/23 2342 10     Pain Loc --      Pain Edu? --      Excl. in [place]? --     Most recent vital signs: Vitals:   01/05/23 2337 01/06/23 0318  BP: (!) 120/52 118/69  Pulse: 72 79  Resp: 16  18  Temp: 98 F (36.7 C) 98.1 F (36.7 C)  SpO2: 100% 98%    CONSTITUTIONAL: Alert, responds appropriately to questions. Well-appearing; well-nourished HEAD: Normocephalic, atraumatic EYES: Conjunctivae clear, pupils appear equal, sclera nonicteric, small lateral subconjunctival hemorrhage to right eye, no hyphema or hypopion, EOMI, no chemosis, see nursing notes for visual acuity ENT: normal nose; moist mucous membranes NECK: Supple, normal ROM CARD: RRR; S1 and S2 appreciated RESP: Normal chest excursion without splinting or tachypnea; breath sounds clear and equal bilaterally; no wheezes, no rhonchi, no rales, no hypoxia or respiratory distress, speaking full sentences ABD/GI: Non-distended; soft, non-tender, no rebound, no guarding, no peritoneal signs BACK: The back appears normal EXT: Normal ROM in all joints; no deformity noted, no edema SKIN: Normal color for age and race; warm; no rash on exposed skin NEURO: Moves all extremities equally, normal speech, normal gait, no facial asymmetry, reports normal sensation PSYCH: The patient's mood and manner are appropriate.   ED Results / Procedures / Treatments   LABS: (all labs ordered are listed, but only abnormal results are displayed) Labs Reviewed  CBC WITH DIFFERENTIAL/PLATELET - Abnormal; Notable for the following components:      Result Value   WBC 11.1 (*)    Hemoglobin 10.6 (*)    HCT 32.5 (*)    RDW 16.4 (*)    All other components within normal limits  HCG, QUANTITATIVE, PREGNANCY - Abnormal; Notable for the following components:   hCG, Beta Chain, Quant, [place] 77,845 (*)    All other components within normal limits  URINALYSIS, ROUTINE W REFLEX MICROSCOPIC - Abnormal; Notable for the following components:   Color, Urine YELLOW (*)    APPearance CLEAR (*)    Ketones, ur 5 (*)    All other components within normal limits  COMPREHENSIVE METABOLIC PANEL - Abnormal; Notable for the following components:   Glucose, Bld  105 (*)    AST 13 (*)    All other components within normal limits  POC URINE PREG, ED - Abnormal; Notable for the following components:   Preg Test, Ur POSITIVE (*)    All other components within normal limits     EKG:  RADIOLOGY: My personal review and interpretation of imaging:    I have personally reviewed all radiology reports.   No results found.   PROCEDURES:  Critical Care performed: No     Procedures    IMPRESSION / MDM / ASSESSMENT AND PLAN / ED COURSE  I reviewed the triage vital signs and the nursing notes.    Patient here with complaints of headache in the setting of new diagnosis of pregnancy.  No focal neurologic deficits, fever, meningismus.  No improvement with Tylenol from triage.  Also has small right subconjunctival hemorrhage without vision changes.    DIFFERENTIAL DIAGNOSIS (includes but not limited to):   migraine, doubt ICH, meningitis, CVT, mass   Patient's presentation is most consistent with acute complicated illness / injury requiring diagnostic workup.   PLAN: Patient here with headache.  Urine pregnancy test is positive in triage.  She states no improvement in headache with Tylenol.  Will obtain CBC, CMP, urinalysis.  Will give migraine cocktail.  She is well-appearing here, smiling, neurologically intact, afebrile without meningismus.  Low suspicion for CVT or intracranial hemorrhage.  I do not feel she needs imaging of her brain at this time.   MEDICATIONS GIVEN IN ED: Medications  acetaminophen (TYLENOL) tablet 1,000 mg (1,000 mg Oral Given 01/06/23 0031)  sodium chloride 0.9 % bolus 1,000 mL (0 mLs Intravenous Stopped 01/06/23 0248)  metoCLOPramide (REGLAN) injection 10 mg (10 mg Intravenous Given 01/06/23 0214)  diphenhydrAMINE (BENADRYL) injection 25 mg (25 mg Intravenous Given 01/06/23 0213)     ED COURSE: Patient reports her headache is completely gone after IV medications.  She feels comfortable with plan for discharge home.   Recommended Tylenol as needed for headaches at home.  Recommend she start a prenatal vitamin.  Discussed importance of OB follow-up.  Patient verbalized understanding.   At this time, I do not feel there is any life-threatening condition present. I reviewed all nursing notes, vitals, pertinent previous records.  All lab and urine results, EKGs, imaging ordered have been independently reviewed and interpreted by myself.  I reviewed all available radiology reports from any imaging ordered this visit.  Based on my assessment, I feel the patient is safe to be discharged home without further emergent workup and can continue workup as an outpatient as needed. Discussed all findings, treatment plan as well as usual and customary return precautions.  They verbalize understanding and are comfortable with this plan.  Outpatient follow-up has been provided as needed.  All questions have been answered.    CONSULTS:  none   OUTSIDE RECORDS REVIEWED:  Reviewed OB notes in 2022.       FINAL CLINICAL IMPRESSION(S) / ED DIAGNOSES   Final diagnoses:  Generalized headache  Subconjunctival hemorrhage of right eye  First trimester pregnancy     Rx / DC Orders   ED Discharge Orders     None        Note:  This document was prepared using Dragon voice recognition software and may include unintentional dictation errors.   Gracious Renken, Delice Bison, DO 01/06/23 [phone]

## 2023-05-11 ENCOUNTER — Ambulatory Visit: Payer: Self-pay

## 2023-05-11 ENCOUNTER — Ambulatory Visit (LOCAL_COMMUNITY_HEALTH_CENTER): Payer: Self-pay

## 2023-05-11 ENCOUNTER — Ambulatory Visit: Payer: Self-pay | Admitting: Family Medicine

## 2023-05-11 DIAGNOSIS — Z23 Encounter for immunization: Secondary | ICD-10-CM

## 2023-05-11 DIAGNOSIS — Z7185 Encounter for immunization safety counseling: Secondary | ICD-10-CM

## 2023-05-11 NOTE — Progress Notes (Signed)
Patient is here for an [facility] appointment.   Patient read an email, that required the patient to have certain Immunizations and Gonorrhea testing, however, the Gonorrhea testing was for women 19 - 29 years of age.   Patient stated that she had spoken to another source and was told that she was not required to have Gonorrhea testing.   Patient taken to Nurse Clinic for her Immunizations. BTL information sheet given to patient.   Tobi Bastos #[id number] from [facility] [employer] and Romelle Starcher RN assisted with the visit.  Berdie Ogren, RN

## 2023-05-11 NOTE — Progress Notes (Signed)
In nurse clinic with children for vaccines as needed for immigration.  [facility] [place] and [facility] line [phone] served as interpreters today.   Denies having any other vaccines besides what is [place] in [place]  No health insurance.   Per Epic, varicella titer on 10/24/2020 showed immunity and therefore, no varicella vaccine indicated. ROI signed and varicella result copy given. [facility] updated.   No bc method. LMP 04/30/2023. Last sex 05/08/2023. Consult with Dr Ralene Bathe through epic message who recommends to proceed with vaccines today and to offer Quick Start bc method and offer [facility] appt.   RN explained provider recommendations and patient states she is planning to have BTL and "has forms" for this.   Vaccines given today:  Twinrix- meets criteria for state supplied vaccine,  MMR - state supply. Covid Pfizer Comirnaty 2023-24 (12 yrs+)- Bridge program Flu - private- paid for vaccine Polio- private- paid for vaccine.   Tolerated vaccines well. Stayed for approx 10 min observation after covid vaccine without problem. Updated [facility] copy given and recommended schedule explained. Jerel Shepherd, RN

## 2023-05-16 NOTE — Progress Notes (Signed)
Attestation of Attending Supervision of clinical support staff: I agree with the care provided to this patient and was available for any consultation.  I have reviewed the RN's note and chart. I was available for consult and to see the patient if needed.   Zackari Ruane MD MPH Attending Physician Faculty Practice- [facility]  

## 2023-07-27 HISTORY — PX: DILATION AND CURETTAGE OF UTERUS: SHX78

## 2023-08-22 ENCOUNTER — Ambulatory Visit (LOCAL_COMMUNITY_HEALTH_CENTER): Payer: Self-pay | Admitting: Family Medicine

## 2023-08-22 ENCOUNTER — Encounter: Payer: Self-pay | Admitting: Family Medicine

## 2023-08-22 ENCOUNTER — Ambulatory Visit: Payer: Self-pay

## 2023-08-22 VITALS — BP 109/66 | HR 66 | Ht 60.0 in | Wt 150.8 lb

## 2023-08-22 DIAGNOSIS — Z309 Encounter for contraceptive management, unspecified: Secondary | ICD-10-CM

## 2023-08-22 DIAGNOSIS — W448XXA Other foreign body entering into or through a natural orifice, initial encounter: Secondary | ICD-10-CM

## 2023-08-22 NOTE — Progress Notes (Signed)

## 2023-08-22 NOTE — Progress Notes (Signed)
[facility] Visit  [facility]  Subjective:  Sabrina Pace is a 27 y.o. being seen today for   Chief Complaint  Patient presents with   Acute Visit    Tampon removal    Patient reports to the clinic with concern of a retained tampon that has been present for 2 days. She states she had sex yesterday with the tampon in. She reports that today at noon she began having abdominal pain that was bearable. She noticed an odor that started this morning. She denies fever, chills, n/v.  She reports having an abortion 4 weeks ago and having a Nexplanon implanted that same day. Since having the Nexplanon implanted she reports spotting constantly.  Of note, Nexplanon was palpated.  Last PAP 10/24/20 NILM, HPV testing criteria not met.    Health Maintenance Due  Topic Date Due   INFLUENZA VACCINE  06/23/2023    Review of Systems  Constitutional:  Negative for chills and fever.  Gastrointestinal:  Positive for abdominal pain (Began today around noon.).  Genitourinary:        Noticed a vaginal odor that began this morning.   All other systems reviewed and are negative.  The following portions of the patient's history were reviewed and updated as appropriate: allergies, current medications, past family history, past medical history, past social history, past surgical history and problem list. Problem list updated.  See flowsheet for other program required questions.  Objective:   Vitals:   08/22/23 1323  BP: 109/66  Pulse: 66  Weight: 150 lb 12.8 oz (68.4 kg)  Height: 5' (1.524 m)    Physical Exam Vitals and nursing note reviewed. Exam conducted with a chaperone present Lenice Llamas, FNP present as chaperone for exam).  Constitutional:      General: She is not in acute distress.    Appearance: Normal appearance. She is not ill-appearing, toxic-appearing or diaphoretic.    HENT:     Head: Normocephalic and atraumatic.      Mouth/Throat:     Mouth: Mucous membranes are moist.     Pharynx: Oropharynx is clear. No oropharyngeal exudate or posterior oropharyngeal erythema.  Eyes:     General:        Right eye: No discharge.        Left eye: No discharge.  Pulmonary:     Effort: Pulmonary effort is normal.  Abdominal:     General: Abdomen is flat. A surgical scar is present. There is no distension.     Palpations: There is no mass.     Tenderness: There is no abdominal tenderness. There is no guarding or rebound.       Comments: Healed surgical incision.   Genitourinary:    General: Normal vulva.     Exam position: Lithotomy position.     Pubic Area: No rash or pubic lice.      Tanner stage (genital): 5.     Labia:        Right: No rash or lesion.        Left: No rash or lesion.      Vagina: Foreign body present. Bleeding present. No vaginal discharge, erythema, tenderness or lesions.     Cervix: Normal. No cervical motion tenderness, discharge, friability, lesion, erythema or cervical bleeding.     Comments: pH = not assessed.   With speculum inserted, retained tampon was visualized and easily removed without complication using gynecological clamps with hoops. Odor present upon  removal.   Minimal amount of blood visualized within vaginal vault consistent with menstrual blood. No additional discharge noted.     Lymphadenopathy:     Lower Body: No right inguinal adenopathy. No left inguinal adenopathy.  Skin:    General: Skin is warm and dry.     Findings: No rash.     Comments: Skin tone appropriate for ethnicity. Exposed areas only.   Neurological:     Mental Status: She is alert and oriented to person, place, and time.  Psychiatric:        Attention and Perception: Attention normal.        Mood and Affect: Mood normal.        Speech: Speech normal.        Behavior: Behavior normal. Behavior is cooperative.        Thought Content: Thought content normal.    Assessment and Plan:   Sabrina Pace is a 27 y.o. female presenting to the [facility] Department for a Women's Health problem visit  1. Retained tampon, initial encounter Tampon removed without incident. Patient given warning signs of systemic and local infection that would require follow-up with emergency care. Patient verbalized understanding and agreed with this plan. At this time the patient shows no signs or symptoms of infection and no medications or f/u visits were ordered. Patient advised to contact our office with any concerns or questions in the future.   Return if symptoms worsen or fail to improve.  No future appointments.  Total time with patient 30 minutes.   Due to language barrier, an interpreter was present during the history-taking and subsequent discussion (and for physical exam) with this patient.  Deanna with [employer] [phone]. Roddie Mc also assisted during the second portion of the visit for PE.   Edmonia James, NP

## 2023-08-26 NOTE — Progress Notes (Signed)
Patient ID: Sabrina Pace, female   DOB: [birth year], 27 y.o.   MRN: [id number]  Addendum to the patient's visit note for 08/22/23: Patient was seen on 08/22/23 related to care of a retained tampon. This was removed without incident. Please see visit note from that day for complete information regarding this.  Additionally, during a chart review it was noted the patient had an elective abortion at another facility on 07/27/23. Patient was medicated with Misoprostol taken the morning of and a vacuum assisted procedure was performed. Following the procedure, an ultrasound was performed to ensure no products of conception remained which was negative.  With this information, it is highly unlikely the patient's reported abdominal pain was due to retained products of conception and most consistent with a retained tampon that was visualized and removed during patient's visit at the [facility] on 08/22/23 without incident.

## 2023-09-13 ENCOUNTER — Other Ambulatory Visit: Payer: Self-pay

## 2023-09-13 ENCOUNTER — Telehealth: Payer: Self-pay

## 2023-09-20 NOTE — Telephone Encounter (Signed)
TC with patient re: LTBI tx. Patient in ncedss #[phone] as status changer. Declines LTBI tx. Richmond Campbell, RN

## 2023-11-10 ENCOUNTER — Ambulatory Visit: Payer: Self-pay

## 2023-11-10 VITALS — BP 127/60 | Ht 60.0 in | Wt 150.5 lb

## 2023-11-10 DIAGNOSIS — Z3202 Encounter for pregnancy test, result negative: Secondary | ICD-10-CM

## 2023-11-10 DIAGNOSIS — Z3009 Encounter for other general counseling and advice on contraception: Secondary | ICD-10-CM

## 2023-11-10 LAB — PREGNANCY, URINE: Preg Test, Ur: NEGATIVE

## 2023-11-10 NOTE — Progress Notes (Signed)
UPT negative. Negative results reviewed. Negative preg packet given and reviewed.  Patient states she is in a hurry as she is late in picking up her children at school. Visit was cut short d/t her time constraints.  Patient states she has been bleeding since 10/21/23 and has headaches and "leaking milk" from breast.  She wanted to confirm whether she was pregnant or not. Has implant that was inserted 07/27/2023.  Patient states she has [facility] appt 11/21/2023 for PE. RN Advised to keep this appt. [facility] line #[phone] Jerel Shepherd, RN

## 2023-11-21 ENCOUNTER — Ambulatory Visit: Payer: Self-pay

## 2023-12-23 DIAGNOSIS — K6289 Other specified diseases of anus and rectum: Secondary | ICD-10-CM | POA: Insufficient documentation

## 2023-12-23 DIAGNOSIS — N9489 Other specified conditions associated with female genital organs and menstrual cycle: Secondary | ICD-10-CM | POA: Insufficient documentation

## 2023-12-23 DIAGNOSIS — K5900 Constipation, unspecified: Secondary | ICD-10-CM | POA: Insufficient documentation

## 2023-12-24 ENCOUNTER — Emergency Department
Admission: EM | Admit: 2023-12-24 | Discharge: 2023-12-24 | Disposition: A | Discharge status: Home / Self Care | Payer: Self-pay | Attending: Emergency Medicine | Admitting: Emergency Medicine

## 2023-12-24 DIAGNOSIS — K6289 Other specified diseases of anus and rectum: Secondary | ICD-10-CM

## 2023-12-24 DIAGNOSIS — K5900 Constipation, unspecified: Secondary | ICD-10-CM

## 2023-12-24 DIAGNOSIS — N949 Unspecified condition associated with female genital organs and menstrual cycle: Secondary | ICD-10-CM

## 2023-12-24 LAB — CHLAMYDIA/NGC RT PCR (ARMC ONLY)
Chlamydia Tr: NOT DETECTED
N gonorrhoeae: NOT DETECTED

## 2023-12-24 LAB — URINALYSIS, ROUTINE W REFLEX MICROSCOPIC
Bacteria, UA: NONE SEEN
Bilirubin Urine: NEGATIVE
Glucose, UA: NEGATIVE mg/dL
Ketones, ur: NEGATIVE mg/dL
Nitrite: NEGATIVE
Protein, ur: NEGATIVE mg/dL
Specific Gravity, Urine: 1.026 (ref 1.005–1.030)
pH: 5 (ref 5.0–8.0)

## 2023-12-24 LAB — WET PREP, GENITAL
Clue Cells Wet Prep HPF POC: NONE SEEN
Sperm: NONE SEEN
Trich, Wet Prep: NONE SEEN
WBC, Wet Prep HPF POC: >=10 — AB (ref <10)
Yeast Wet Prep HPF POC: NONE SEEN

## 2023-12-24 LAB — POC URINE PREG, ED: Preg Test, Ur: NEGATIVE

## 2023-12-24 MED ORDER — LIDOCAINE-HYDROCORTISONE ACE 3-1 % RE KIT: 1.0000 | PACK | Freq: Two times a day (BID) | RECTAL | 0 refills | Status: DC

## 2023-12-24 MED ORDER — ACETAMINOPHEN 325 MG PO TABS
650.0000 mg | ORAL_TABLET | Freq: Once | ORAL | Status: AC
  Administered 2023-12-24: 650 mg via ORAL
  Filled 2023-12-24: qty 2

## 2023-12-24 MED ORDER — NIFEDIPINE 0.3 % OINTMENT: 1.0000 | TOPICAL_OINTMENT | Freq: Four times a day (QID) | CUTANEOUS | 0 refills | Status: DC

## 2023-12-24 NOTE — ED Provider Notes (Signed)
[facility] Provider Note    Event Date/Time   First MD Initiated Contact with Patient 12/24/23 0510     (approximate)   History   Vaginal Pain   HPI  Sabrina Pace is a 28 year old female presenting to the emergency department for evaluation of vaginal and rectal discomfort.  For the past few days, patient has had pain around her vaginal area when peeing.  Also reports pain around her anus.  Has had some recent constipation with straining and reports that her pain is worse in her rectum when attempting to use the bathroom.     Physical Exam   Triage Vital Signs: ED Triage Vitals  Encounter Vitals Group     BP 12/24/23 0005 116/63     Systolic BP Percentile --      Diastolic BP Percentile --      Pulse Rate 12/24/23 0005 70     Resp 12/24/23 0005 18     Temp 12/24/23 0005 98.2 F (36.8 C)     Temp Source 12/24/23 0005 Oral     SpO2 12/24/23 0005 98 %     Weight --      Height --      Head Circumference --      Peak Flow --      Pain Score 12/24/23 0508 Asleep     Pain Loc --      Pain Education --      Exclude from Growth Chart --     Most recent vital signs: Vitals:   12/24/23 0333 12/24/23 0508  BP: 109/74   Pulse: 86   Resp: 18   Temp: 97.7 F (36.5 C)   SpO2: 97% 98%     General: Awake, interactive  CV:  Regular rate, good peripheral perfusion.  Resp:  Unlabored respirations.  Abd:  Nondistended, soft, nontender to palpation, well-healing incision over lower abdomen.  Pain with palpation around anus with some skin breakdown and what appears to be small anal fissure GU:  External genitalia unremarkable but tender to palpation. Speculum exam with physiologic whitish vaginal discharge. Vaginal wall mucosa is unremarkable. Cervix closed in appearance without any protruding material. Bimanual exam without cervical motion tenderness, adnexal tenderness or any masses appreciated.  Neuro:  Symmetric facial movement,  fluid speech   ED Results / Procedures / Treatments   Labs (all labs ordered are listed, but only abnormal results are displayed) Labs Reviewed  WET PREP, GENITAL - Abnormal; Notable for the following components:      Result Value   WBC, Wet Prep HPF POC >=10 (*)    All other components within normal limits  URINALYSIS, ROUTINE W REFLEX MICROSCOPIC - Abnormal; Notable for the following components:   Color, Urine YELLOW (*)    APPearance HAZY (*)    Hgb urine dipstick SMALL (*)    Leukocytes,Ua SMALL (*)    All other components within normal limits  CHLAMYDIA/NGC RT PCR ([facility] ONLY)            POC URINE PREG, ED     EKG EKG independently reviewed interpreted by myself (ER attending) demonstrates:    RADIOLOGY Imaging independently reviewed and interpreted by myself demonstrates:    PROCEDURES:  Critical Care performed: No  Procedures   MEDICATIONS ORDERED IN ED: Medications  acetaminophen (TYLENOL) tablet 650 mg (650 mg Oral Given 12/24/23 0444)     IMPRESSION / MDM / ASSESSMENT AND PLAN / ED COURSE  I reviewed  the triage vital signs and the nursing notes.  Differential diagnosis includes, but is not limited to, UTI, BV, hemorrhoid, anal fissure skin breakdown  Patient's presentation is most consistent with acute illness / injury with system symptoms.  28 year old female presenting with external vaginal and rectal pain.  Urine without evidence of infection.  Wet prep with white blood cells, GC pending.  UPT negative.  Discussed initiation of bowel regimen as well as sitz bath's and topical treatment for her anal pain.  She was also given information for follow-up with general surgery.  Regarding her vaginal discomfort, patient was given information for follow-up with OB/GYN.  Strict return precautions were provided.  Patient was discharged stable condition.      FINAL CLINICAL IMPRESSION(S) / ED DIAGNOSES   Final diagnoses:  Anal pain  Constipation, unspecified  constipation type  Vaginal discomfort     Rx / DC Orders   ED Discharge Orders          Ordered    nifedipine 0.3 % ointment  4 times daily        12/24/23 0806    lidocaine-hydrocortisone (ANAMANTLE) 3-1 % KIT  2 times daily        12/24/23 0806             Note:  This document was prepared using Dragon voice recognition software and may include unintentional dictation errors.   Trinna Post, MD 12/24/23 [phone]

## 2023-12-24 NOTE — ED Notes (Signed)
Pt asleep, RR even and  unlabored.

## 2023-12-24 NOTE — ED Triage Notes (Signed)
Patient C/O internal and external vaginal pain that began two days ago. She is also experiencing burning during urination. Patient states she recently began using tampons, and she is also using some kind of vaginal suppository she found on TikTok,

## 2023-12-24 NOTE — Discharge Instructions (Addendum)
You were seen in the ER today for evaluation of your anal and vaginal discomfort.  I sent a topical treatment to your pharmacy that you can use to help with your pain around your anus.  I have also included information for follow-up with general surgery and OB/GYN.  Return to the ER for new or worsening symptoms.

## 2024-01-09 ENCOUNTER — Ambulatory Visit: Payer: Self-pay | Admitting: Surgery

## 2024-01-27 ENCOUNTER — Encounter: Payer: Self-pay | Admitting: Emergency Medicine

## 2024-01-27 ENCOUNTER — Emergency Department: Payer: Self-pay

## 2024-01-27 ENCOUNTER — Emergency Department
Admission: EM | Admit: 2024-01-27 | Discharge: 2024-01-27 | Disposition: A | Discharge status: Home / Self Care | Payer: Self-pay | Attending: Emergency Medicine | Admitting: Emergency Medicine

## 2024-01-27 ENCOUNTER — Other Ambulatory Visit: Payer: Self-pay

## 2024-01-27 DIAGNOSIS — N939 Abnormal uterine and vaginal bleeding, unspecified: Secondary | ICD-10-CM | POA: Insufficient documentation

## 2024-01-27 LAB — BASIC METABOLIC PANEL
Anion gap: 9 (ref 5–15)
BUN: 14 mg/dL (ref 6–20)
CO2: 24 mmol/L (ref 22–32)
Calcium: 9 mg/dL (ref 8.9–10.3)
Chloride: 106 mmol/L (ref 98–111)
Creatinine, Ser: 0.52 mg/dL (ref 0.44–1.00)
GFR, Estimated: >60 mL/min (ref >60)
Glucose, Bld: 120 mg/dL — ABNORMAL HIGH (ref 70–99)
Potassium: 3.3 mmol/L — ABNORMAL LOW (ref 3.5–5.1)
Sodium: 139 mmol/L (ref 135–145)

## 2024-01-27 LAB — POC URINE PREG, ED: Preg Test, Ur: NEGATIVE

## 2024-01-27 LAB — CBC
HCT: 29.5 % — ABNORMAL LOW (ref 36.0–46.0)
Hemoglobin: 9 g/dL — ABNORMAL LOW (ref 12.0–15.0)
MCH: 23.3 pg — ABNORMAL LOW (ref 26.0–34.0)
MCHC: 30.5 g/dL (ref 30.0–36.0)
MCV: 76.2 fL — ABNORMAL LOW (ref 80.0–100.0)
Platelets: 448 10*3/uL — ABNORMAL HIGH (ref 150–400)
RBC: 3.87 MIL/uL (ref 3.87–5.11)
RDW: 15.4 % (ref 11.5–15.5)
WBC: 9.9 10*3/uL (ref 4.0–10.5)
nRBC: 0 % (ref 0.0–0.2)

## 2024-01-27 LAB — IRON AND TIBC
Iron: 20 ug/dL — ABNORMAL LOW (ref 28–170)
Saturation Ratios: 4 % — ABNORMAL LOW (ref 10.4–31.8)
TIBC: 489 ug/dL — ABNORMAL HIGH (ref 250–450)
UIBC: 469 ug/dL

## 2024-01-27 LAB — FERRITIN: Ferritin: 3 ng/mL — ABNORMAL LOW (ref 11–307)

## 2024-01-27 LAB — LACTATE DEHYDROGENASE: LDH: 123 U/L (ref 98–192)

## 2024-01-27 MED ORDER — KETOROLAC TROMETHAMINE 30 MG/ML IJ SOLN
30.0000 mg | Freq: Once | INTRAMUSCULAR | Status: AC
  Administered 2024-01-27: 30 mg via INTRAMUSCULAR
  Filled 2024-01-27: qty 1

## 2024-01-27 MED ORDER — IBUPROFEN 400 MG PO TABS: 400.0000 mg | ORAL_TABLET | Freq: Three times a day (TID) | ORAL | 0 refills | Status: AC | PRN

## 2024-01-27 NOTE — Discharge Instructions (Addendum)
 Your evaluated in the ED for vaginal bleeding.  Your ultrasound is pending and you are deciding to leave before final results.  We had discussed with OB/GYN and the recommendation is for you to receive a iron infusion.  We have reached out to the medical oncology who will help set up an outpatient appointment for an iron infusion.  NSAIDs are usually started on the first day of bleeding and should be continued for two to three days or until menstruation ceases. Example: Ibuprofen 600 mg two to four times daily.   I have sent ibuprofen 600 mg to your pharmacy for you to take.  Please look out for a phone call from their office setting up this appointment sometime early next week.  Please continue to go to your scheduled appointment for Nexplanon removal at the health department next Wednesday.  Please review attached patient education on Nexplanon implant for further information and abnormal uterine bleeding.

## 2024-01-27 NOTE — ED Provider Notes (Signed)
 [address] [facility] Provider Note    Event Date/Time   First MD Initiated Contact with Patient 01/27/24 1328     (approximate)   History   Vaginal Bleeding   HPI  Sabrina Pace is a 28 y.o. female with history of anemia requiring transfusion and as listed in EMR presents to the emergency department for evaluation of vaginal bleeding.  Bleeding started in September 2024 and has been continuous.  She states that on occasion she will not have bleeding for 3 days but then it will return.  Bleeding is occasionally heavy.  She reports bright red blood with intermittent large clots.  She is having intermittent pelvic pain as well.  She denies fever, nausea, vomiting, or concern for STI. [facility] and was advised to come here instead.   Physical Exam   Triage Vital Signs: ED Triage Vitals  Encounter Vitals Group     BP 01/27/24 1247 (!) 121/33     Systolic BP Percentile --      Diastolic BP Percentile --      Pulse Rate 01/27/24 1247 79     Resp 01/27/24 1247 18     Temp 01/27/24 1247 98.1 F (36.7 C)     Temp Source 01/27/24 1247 Oral     SpO2 01/27/24 1247 100 %     Weight 01/27/24 1249 149 lb 14.6 oz (68 kg)     Height 01/27/24 1249 5' (1.524 m)     Head Circumference --      Peak Flow --      Pain Score 01/27/24 1249 7     Pain Loc --      Pain Education --      Exclude from Growth Chart --     Most recent vital signs: Vitals:   01/27/24 1247 01/27/24 1307  BP: (!) 121/33 110/60  Pulse: 79   Resp: 18   Temp: 98.1 F (36.7 C)   SpO2: 100%     General: Awake, no distress.  CV:  Good peripheral perfusion.  Resp:  Normal effort.  Abd:  No distention.  Other:  Pelvic exam: Dark red blood noted in the vaginal vault.  No clots.  Cervix is closed.  No cervical motion tenderness.   ED Results / Procedures / Treatments   Labs (all labs ordered are listed, but only abnormal results are  displayed) Labs Reviewed  CBC - Abnormal; Notable for the following components:      Result Value   Hemoglobin 9.0 (*)    HCT 29.5 (*)    MCV 76.2 (*)    MCH 23.3 (*)    Platelets 448 (*)    All other components within normal limits  BASIC METABOLIC PANEL - Abnormal; Notable for the following components:   Potassium 3.3 (*)    Glucose, Bld 120 (*)    All other components within normal limits  FERRITIN - Abnormal; Notable for the following components:   Ferritin 3 (*)    All other components within normal limits  IRON AND TIBC - Abnormal; Notable for the following components:   Iron 20 (*)    TIBC 489 (*)    Saturation Ratios 4 (*)    All other components within normal limits  LACTATE DEHYDROGENASE  POC URINE PREG, ED     EKG  Not indicated   RADIOLOGY  Image and radiology report reviewed and interpreted by me. Radiology report  consistent with the same.  Pending  PROCEDURES:  Critical Care performed: No  Procedures   MEDICATIONS ORDERED IN ED:  Medications  ketorolac (TORADOL) 30 MG/ML injection 30 mg (30 mg Intramuscular Given 01/27/24 1726)     IMPRESSION / MDM / ASSESSMENT AND PLAN / ED COURSE   I have reviewed the triage note.  Differential diagnosis includes, but is not limited to, menorrhagia, pregnancy, anemia, ovarian cysts, leiomyoma  Patient's presentation is most consistent with acute complicated illness / injury requiring diagnostic workup.  28 year old female presenting to the emergency department for treatment and evaluation of pelvic pain and vaginal bleeding that has been persistent since September.  See HPI for further details.  Pelvic exam reveals dark red blood in the vaginal vault but the cervix is closed.  She has no cervical motion tenderness.  Plan will be to get an ultrasound for further evaluation.  She has an Implanon in her left arm that is scheduled to be removed at the health department on March 13.  Labs today shows that she  is anemic with a hemoglobin of 9.0 and hematocrit of 29.5.  Platelet count is 448.  Mildly hypokalemic at 3.3.  Nonfasting glucose is 120.  BUN and creatinine are normal.  Awaiting urine pregnancy.  Awaiting results of ultrasound. Care transitioned to [facility] Romeo Apple, PA-C who will follow up on US imaging and discuss with OB/GYN on call.   Clinical Course as of 01/30/24 1610  Caleen Essex Jan 27, 2024  1657 Discussed with Roney Jaffe, CNM - recommendation Iron transfusion 300MG  to before leaving ED and next week start PO iron otc  OTC NSAIDS regimen     [MH]  1747 Discussed with medical oncologist Dr. Donneta Romberg who reccomedned iron studies and will reach to the patient for outpatient iron infusion in the clinic sometime early next week. Spoke with lab and blood add on of iron studies in process.  [MH]  1749 Patient is needing to leave without results of her ultrasound due to her babysitter not being able to keep her children. Discussed risk of leaving without proper management and patient verbalized understanding. [MH]    Clinical Course User Index [MH] Romeo Apple, Myah A, PA-C    FINAL CLINICAL IMPRESSION(S) / ED DIAGNOSES   Final diagnoses:  Vaginal bleeding     Rx / DC Orders   ED Discharge Orders          Ordered    ibuprofen (ADVIL) 400 MG tablet  Every 8 hours PRN        01/27/24 1739             Note:  This document was prepared using Dragon voice recognition software and may include unintentional dictation errors.   Chinita Pester, FNP 01/30/24 [id number]    Delton Prairie, MD 02/03/24 [phone]

## 2024-01-27 NOTE — ED Triage Notes (Signed)
 Patient to ED via POV for vaginal bleeding. Seen at health department yesterday to get her birth control implant removed and was told to come to ED. States she has hx of anemia.

## 2024-01-27 NOTE — ED Provider Notes (Signed)
-----------------------------------------   4:49 PM on 01/27/2024 -----------------------------------------  Blood pressure 110/60, pulse 79, temperature 98.1 F (36.7 C), temperature source Oral, resp. rate 18, height 5' (1.524 m), weight 68 kg, SpO2 100%.  Assuming care from PA, Triplett  In short, Sabrina Pace is a 28 y.o. female with a chief complaint of Vaginal Bleeding .  Refer to the original H&P for additional details.  The current plan of care is to awaiting ultrasound and make appropriate disposition.  Clinical Course as of 01/27/24 1849  Caleen Essex Jan 27, 2024  1657 Discussed with Roney Jaffe, CNM - recommendation Iron transfusion 300MG  to before leaving ED and next week start PO iron otc  OTC NSAIDS regimen     [MH]  1747 Discussed with medical oncologist Dr. Donneta Romberg who reccomedned iron studies and will reach to the patient for outpatient iron infusion in the clinic sometime early next week. Spoke with lab and blood add on of iron studies in process.  [MH]  1749 Patient is needing to leave without results of her ultrasound due to her babysitter not being able to keep her children. Discussed risk of leaving without proper management and patient verbalized understanding. [MH]    Clinical Course User Index [MH] Kern Reap A, PA-C    Patient had Nexplanon insertion 6 months ago and reports this is when the bleeding began.  Ever since then she has had heavy bleeding.  She has an appointment this Wednesday with the health department for Nexplanon removal.  Discussed case with [place], CNM who has suspicion that Nexplanon could be the cause of ongoing vaginal bleeding and recommended radiation NSAID recommend OTC.  Patient stated she could not stay for iron transfusion in the ED as she had to go home for her children.  ----------------------------------------- 6:54 PM on 01/27/2024 -----------------------------------------  Patient was in stable condition for  discharge home.  She was asymptomatic.  Ultrasound is reassuring.   Romeo Apple, Adessa Primiano A, PA-C 01/27/24 1929    Concha Se, MD 01/27/24 [phone]

## 2024-02-02 ENCOUNTER — Ambulatory Visit: Payer: Self-pay

## 2024-02-03 ENCOUNTER — Inpatient Hospital Stay: Payer: Self-pay | Admitting: Internal Medicine

## 2024-02-03 ENCOUNTER — Inpatient Hospital Stay: Payer: Self-pay

## 2024-02-09 ENCOUNTER — Encounter: Payer: Self-pay | Admitting: Emergency Medicine

## 2024-02-09 ENCOUNTER — Inpatient Hospital Stay: Payer: Self-pay | Attending: Internal Medicine | Admitting: Internal Medicine

## 2024-02-09 ENCOUNTER — Inpatient Hospital Stay: Payer: Self-pay

## 2024-03-12 ENCOUNTER — Ambulatory Visit: Payer: Self-pay | Admitting: Family Medicine

## 2024-03-12 ENCOUNTER — Encounter: Payer: Self-pay | Admitting: Family Medicine

## 2024-03-12 ENCOUNTER — Ambulatory Visit (LOCAL_COMMUNITY_HEALTH_CENTER): Payer: Self-pay | Admitting: Family Medicine

## 2024-03-12 VITALS — BP 106/64 | HR 74 | Ht 62.5 in | Wt 150.2 lb

## 2024-03-12 VITALS — BP 106/64 | HR 74 | Ht 62.0 in | Wt 150.2 lb

## 2024-03-12 DIAGNOSIS — N939 Abnormal uterine and vaginal bleeding, unspecified: Secondary | ICD-10-CM

## 2024-03-12 DIAGNOSIS — Z62810 Personal history of physical and sexual abuse in childhood: Secondary | ICD-10-CM | POA: Insufficient documentation

## 2024-03-12 DIAGNOSIS — Z309 Encounter for contraceptive management, unspecified: Secondary | ICD-10-CM

## 2024-03-12 DIAGNOSIS — R4589 Other symptoms and signs involving emotional state: Secondary | ICD-10-CM

## 2024-03-12 DIAGNOSIS — Z3049 Encounter for surveillance of other contraceptives: Secondary | ICD-10-CM

## 2024-03-12 DIAGNOSIS — Z302 Encounter for sterilization: Secondary | ICD-10-CM | POA: Insufficient documentation

## 2024-03-12 DIAGNOSIS — D500 Iron deficiency anemia secondary to blood loss (chronic): Secondary | ICD-10-CM | POA: Insufficient documentation

## 2024-03-12 DIAGNOSIS — Z3009 Encounter for other general counseling and advice on contraception: Secondary | ICD-10-CM

## 2024-03-12 DIAGNOSIS — Z124 Encounter for screening for malignant neoplasm of cervix: Secondary | ICD-10-CM | POA: Insufficient documentation

## 2024-03-12 HISTORY — DX: Iron deficiency anemia secondary to blood loss (chronic): D50.0

## 2024-03-12 HISTORY — DX: Abnormal uterine and vaginal bleeding, unspecified: N93.9

## 2024-03-12 HISTORY — DX: Other symptoms and signs involving emotional state: R45.89

## 2024-03-12 HISTORY — DX: Encounter for sterilization: Z30.2

## 2024-03-12 LAB — HEMOGLOBIN, FINGERSTICK: Hemoglobin: 8.5 g/dL — ABNORMAL LOW (ref 11.1–15.9)

## 2024-03-12 MED ORDER — IRON (FERROUS SULFATE) 325 (65 FE) MG PO TABS: 1.0000 | ORAL_TABLET | ORAL | Status: DC

## 2024-03-12 MED ORDER — MEDROXYPROGESTERONE ACETATE 10 MG PO TABS
20.0000 mg | ORAL_TABLET | Freq: Two times a day (BID) | ORAL | 0 refills | Status: DC
Start: 2024-03-12 — End: 2024-05-29

## 2024-03-12 NOTE — Progress Notes (Signed)
 [facility] DEPART[facility] [place] [address] [place] [address] Main phone: [phone]  Family Planning Visit - Acute Visit   Due to language barrier, a Spanish interpreter Charlcie Conger, [place] [id number] ) was present by phone during the history-taking, subsequent discussion, and physical exam with this patient.   Subjective:  Sabrina Pace is a 28 y.o. [id number]  being seen today for an annual wellness visit and to discuss contraception options. The patient is currently using hormonal implant for pregnancy prevention. Patient does not want a pregnancy in the next year.   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 Post-procedure ultrasound demonstrated thin endometrial stripe and no retained products of conception. She was instructed to return for follow up in 2 weeks, but chart does not reflect another appointment with [facility].   Negative urine pregnancy tests 11/10/2023, 12/24/2023, and 01/27/2024.  She was seen at [facility] 08/22/2023 for retained tampon removed without incident. That note indicates constant spotting since 9/04 procedures.   Seen in ED 01/27/24 for vaginal bleeding, was found to have unremarkable TVUS. Labs concerning for iron  deficiency anemia with Hgb 9.0, ferritin 3, iron  20, TIBC 489. She declined IV iron  infusion in  ED citing need to leave due to time constraints with childcare. Was given toradol  IM injection 30 mg - patient today reports this helped to stop the bleeding but only for one week.   Was scheduled and no showed with heme onc 3/13 on 3/20. Referral was closed out 3/20 due to "excessive no shows".   Patient reports no intercourse since November, as this is around the time her husband had been deported.   Today she reports intermittent bleeding from September to February, with her periods becoming longer and longer (4 days, then 7 days, then longer). Now has been having continuous bleeding since February. Has not yet been back to the [facility] clinic which performed D&C and placed her Nexplanon. She reports some dizziness but no pre-syncope or fainting.   Review of Systems  Constitutional:  Negative for fever, malaise/fatigue and weight loss.  Respiratory:  Negative for shortness of breath.   Cardiovascular:  Negative for chest pain and palpitations.   See flowsheet for other program required questions.   Diabetes screening This patient is 27 y.o. with a BMI of Body mass index is 27.03 kg/m. Is patient eligible for diabetes screening (age >35 and BMI >25)?  no  Was Hgb A1c ordered? not applicable  STI screening Patient reports 1 of partners in last year.  Does this patient desire STI screening?  No - politely declines  Cervical Cancer Screening  Result Date Procedure Results Follow-ups  10/24/2020 IGP, rfx Aptima HPV ASCU DIAGNOSIS:: Comment Specimen adequacy:: Comment Clinician Provided ICD10: Comment Performed by:: Comment PAP Smear Comment: . Note:: Comment Test Methodology: Comment PAP Reflex: Comment   01/18/2017 HM PAP SMEAR HM Pap smear: Negative  Health Maintenance Due  Topic Date Due   COVID-19 Vaccine (2 - 2024-25 season) 07/24/2023   Cervical Cancer Screening (HPV/Pap Cotest)  02/23/2024   The following portions of the patient's history were reviewed and updated as  appropriate: allergies, current medications, past family history, past medical history, past social history, past surgical history and problem list. Problem list updated.  Objective:   Vitals:   03/12/24 1023  BP: 106/64  Pulse: 74  Weight: 150 lb 3.2 oz (68.1 kg)  Height: 5' 2.5" (1.588 m)   Physical Exam Vitals and nursing note reviewed. Exam conducted with a chaperone present Lawrence Pretty, RN).  Constitutional:      General: She is not in acute distress.    Appearance: Normal appearance. She is not ill-appearing or toxic-appearing.  HENT:     Head: Normocephalic and atraumatic.     Mouth/Throat:     Mouth: Mucous membranes are moist.     Pharynx: Oropharynx is clear. No oropharyngeal exudate or posterior oropharyngeal erythema.  Eyes:     General: No scleral icterus.       Right eye: No discharge.        Left eye: No discharge.     Conjunctiva/sclera: Conjunctivae normal.  Pulmonary:     Effort: Pulmonary effort is normal.  Abdominal:     General: Abdomen is flat. There is no distension.     Palpations: Abdomen is soft. There is no mass.     Tenderness: There is no abdominal tenderness. There is no guarding or rebound.  Genitourinary:    General: Normal vulva.     Exam position: Lithotomy position.     Pubic Area: No rash or pubic lice.      Labia:        Right: No rash or lesion.        Left: No rash or lesion.      Vagina: Normal. No vaginal discharge, erythema, bleeding or lesions.     Cervix: Normal. No cervical motion tenderness, discharge, friability, lesion or erythema.     Uterus: Normal.      Adnexa: Right adnexa normal and left adnexa normal.     Rectum: Normal.     Comments: pH = not performed, as no vaginal complaints Musculoskeletal:        General: Normal range of motion.     Cervical back: Neck supple. No rigidity or tenderness.  Lymphadenopathy:     Head:     Right side of head: No preauricular or posterior auricular adenopathy.     Left side of head:  No preauricular or posterior auricular adenopathy.     Cervical: No cervical adenopathy.     Right cervical: No superficial or posterior cervical adenopathy.    Left cervical: No superficial or posterior cervical adenopathy.     Upper Body:     Right upper body: No supraclavicular adenopathy.     Left upper body: No supraclavicular adenopathy.  Skin:    General: Skin is warm and dry.     Capillary Refill: Capillary refill takes less than 2 seconds.     Findings: No rash.  Neurological:     General: No focal deficit present.     Mental Status: She is alert and oriented to person, place, and time.  Psychiatric:        Mood and Affect: Mood normal.        Behavior: Behavior normal.    Assessment and Plan:  Sabrina Pace  is a 28 y.o. female [id number] presenting to the [facility] Department for an yearly wellness and contraception visit  Contraception counseling: Reviewed options based on patient desire and reproductive life plan. Patient is interested in Female Sterilization. I referred her to [facility] via faxed paper referral today.   Risks, benefits, and typical effectiveness rates were reviewed.  Questions were answered.  Written information was also given to the patient to review.    The patient will follow up in  1 years for surveillance.  The patient was told to call with any further questions, or with any concerns about this method of contraception.  Emphasized use of condoms 100% of the time for STI prevention.  Educated on ECP and assessed need for ECP. Patient does not qualify, as Nexplanon in place.   Abnormal uterine bleeding (AUB) Assessment & Plan: Intermittent, worsening since September. Now constant bleeding since February. Most likely due to Nexplanon. Unlikely fibroids or retained products of conception given TVUS 07/27/23 and 01/27/24 unremarkable. Pregnancy not contributing given Nexplanon in place since 07/27/23 and negative urine pregnancy tests  11/10/2023, 12/24/2023, and 01/27/2024. Discussed options with patient. She relays she is happy with her family size of 5 children and does not desire any future pregnancies. We discussed tubal ligation, to which the patient is quite amenable.   Plan for today:  - Nexplanon removed - Provera  20 mg BID x 7 days - Start PO ferrous sulfate  every other day for anemia - Referral to [facility] for sterilization (will need charity care application)  Orders: -     Hemoglobin, fingerstick -     CBC -     medroxyPROGESTERone  Acetate; Take 2 tablets (20 mg total) by mouth in the morning and at bedtime for 7 days.  Dispense: 28 tablet; Refill: 0  Cervical cancer screening Assessment & Plan: Patient amenable to PAP today. Collected. Will follow results.   Orders: -     IGP, Aptima HPV  Iron  deficiency anemia due to chronic blood loss Assessment & Plan: Fingerstick hemoglobin 8.5 in clinic. Venipuncture sample sent for full CBC. Ferrous sulfate  sample provided, to be taken every other day until gone.   The patient was dispensed ferrous sulfate  today. I provided counseling today regarding the medication. We discussed the medication, the side effects and when to call clinic. Patient given the opportunity to ask questions. Questions answered.    Orders: -     Iron  (Ferrous Sulfate ); Take 1 tablet by mouth every other day.  Encounter for sterilization Assessment & Plan: Paper referral faxed to [facility].     No follow-ups on file.  No future appointments.  Tempie Fee, MD 03/12/24  12:36 PM

## 2024-03-12 NOTE — Assessment & Plan Note (Signed)
 Paper referral faxed to [facility].

## 2024-03-12 NOTE — Patient Instructions (Addendum)
 I translated the following text using Google translate.  Please excuse any errors.  He traducido el siguiente texto con el traductor de Microbiologist. Disculpe cualquier error.   PAP Smear Today we performed a PAP smear to screen for cervical cancer. The results should be back in 1-2 weeks.  Once we have the results we can determine when your next screening should be.   Prueba de Papanicolaou Hoy le realizamos una prueba de Papanicolaou para detectar cncer de cuello uterino. Los resultados deberan estar disponibles en una o [employer]. Una vez que tengamos los [place], podremos determinar cundo debera ser su prxima prueba.  Tubal ligation  Today I referred you to [facility] for a consultation to set up a tubal ligation. This is commonly known as "tying your tubes".  They should call you in 1-2 weeks to schedule an appointment. Please answer your phone and make sure your voice mail is set up.  Please ask for a charity care application.  Ligadura de trompas [employer] remit a [facility] para una consulta para programar una ligadura de trompas. Esto se conoce comnmente como "ligadura de trompas". Deberan llamarla en [employer] semanas para programar una cita. Por favor, conteste el telfono y asegrese de que su buzn de voz est configurado. Solicite una solicitud de atencin caritativa.  Nexplanon removal  Extraccin de Nexplanon Hoy le extrajimos el implante Nexplanon.  Cuidados posteriores:  Es posible que sienta molestias y algunos moretones tras la extraccin de Nexplanon.  Use la venda compresiva durante 24 horas y, posteriormente, una venda adhesiva durante 3 a 5 das.  Deje que las tiras de cierre de la herida se desprendan naturalmente con las duchas habituales; no las retire con Pensions consultant.  Motivos para buscar atencin mdica:  Fiebre y escalofros  Hinchazn y enrojecimiento de la zona de Nexplanon  Supuracin anormal o ftida de la zona de extraccin de  Nexplanon  Anticoncepcin:  Puede quedar embarazada tan solo una semana despus de la extraccin de Nexplanon.  Despus de la extraccin de Nexplanon, y si no desea quedar embarazada en ese momento, debe comenzar de inmediato con otro mtodo anticonceptivo, como el preservativo.  Si desea quedar embarazada, consulte con su mdico.

## 2024-03-12 NOTE — Progress Notes (Signed)
 Duplicate encounter.   Tempie Fee, MD 03/12/24  1:37 PM

## 2024-03-12 NOTE — Assessment & Plan Note (Addendum)
 Intermittent, worsening since September. Now constant bleeding since February. Most likely due to Nexplanon. Unlikely fibroids or retained products of conception given TVUS 07/27/23 and 01/27/24 unremarkable. Pregnancy not contributing given Nexplanon in place since 07/27/23 and negative urine pregnancy tests 11/10/2023, 12/24/2023, and 01/27/2024. Discussed options with patient. She relays she is happy with her family size of 5 children and does not desire any future pregnancies. We discussed tubal ligation, to which the patient is quite amenable.   Plan for today:  - Nexplanon removed - Provera  20 mg BID x 7 days - Start PO ferrous sulfate  every other day for anemia - Referral to [facility] for sterilization (will need charity care application)

## 2024-03-12 NOTE — Assessment & Plan Note (Addendum)
 Fingerstick hemoglobin 8.5 in clinic. Venipuncture sample sent for full CBC. Ferrous sulfate  sample provided, to be taken every other day until gone.   The patient was dispensed ferrous sulfate  today. I provided counseling today regarding the medication. We discussed the medication, the side effects and when to call clinic. Patient given the opportunity to ask questions. Questions answered.

## 2024-03-12 NOTE — Assessment & Plan Note (Signed)
 Patient amenable to PAP today. Collected. Will follow results.

## 2024-03-13 LAB — CBC
Hematocrit: 29.3 % — ABNORMAL LOW (ref 34.0–46.6)
Hemoglobin: 8.7 g/dL — ABNORMAL LOW (ref 11.1–15.9)
MCH: 22 pg — ABNORMAL LOW (ref 26.6–33.0)
MCHC: 29.7 g/dL — ABNORMAL LOW (ref 31.5–35.7)
MCV: 74 fL — ABNORMAL LOW (ref 79–97)
Platelets: 449 10*3/uL (ref 150–450)
RBC: 3.96 x10E6/uL (ref 3.77–5.28)
RDW: 15.8 % — ABNORMAL HIGH (ref 11.7–15.4)
WBC: 8.8 10*3/uL (ref 3.4–10.8)

## 2024-03-20 LAB — IGP, APTIMA HPV
HPV Aptima: NEGATIVE
PAP Smear Comment: 0

## 2024-04-03 ENCOUNTER — Ambulatory Visit: Payer: Self-pay

## 2024-04-19 ENCOUNTER — Telehealth: Payer: Self-pay | Admitting: Family Medicine

## 2024-04-19 NOTE — Telephone Encounter (Signed)
 Due to language barrier, a Spanish interpreter Loreda Rodriguez, [place] [phone]) was present by phone during the discussion with this patient.   Ms. Jeanenne Mile walked in yesterday during afternoon clinic and wanted to speak with myself, however I was busy with patients and could not sit down for conversation in a timely manner. Unsure what her question was about her visit with myself a month ago.   We were unable to reach her by phone this morning, but left a detailed voicemail.   Tempie Fee, MD 04/19/24  9:31 AM

## 2024-05-29 ENCOUNTER — Ambulatory Visit: Payer: Self-pay | Admitting: Family Medicine

## 2024-05-29 ENCOUNTER — Encounter: Payer: Self-pay | Admitting: Family Medicine

## 2024-05-29 ENCOUNTER — Ambulatory Visit: Payer: Self-pay

## 2024-05-29 VITALS — BP 110/61 | HR 76 | Ht 62.0 in | Wt 150.8 lb

## 2024-05-29 DIAGNOSIS — Z30017 Encounter for initial prescription of implantable subdermal contraceptive: Secondary | ICD-10-CM

## 2024-05-29 DIAGNOSIS — Z975 Presence of (intrauterine) contraceptive device: Secondary | ICD-10-CM | POA: Insufficient documentation

## 2024-05-29 DIAGNOSIS — Z3009 Encounter for other general counseling and advice on contraception: Secondary | ICD-10-CM

## 2024-05-29 MED ORDER — ETONOGESTREL 68 MG ~~LOC~~ IMPL
68.0000 mg | DRUG_IMPLANT | Freq: Once | SUBCUTANEOUS | Status: AC
Start: 2024-05-29 — End: 2024-05-29
  Administered 2024-05-29: 68 mg via SUBCUTANEOUS

## 2024-05-29 NOTE — Progress Notes (Signed)
 [employer] HE[facility] DEPARTMENT [facility] [address], [address] [place] [address] Main phone: [phone]  Family Planning Visit - Repeat Yearly Visit  Subjective:  Sabrina Pace is a 28 y.o. [id number]  being seen today for an annual wellness visit and to discuss contraception options. The patient is currently using no method - no contraceptive precautions for pregnancy prevention. Patient does not want a pregnancy in the next year.   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   Was seen 03/12/24 for Nexplanon  removal due to heavy irregular bleeding ever since placement (Nexplanon  was originally placed 07/26/24 at [facility]).   Today, she reports normal periods monthly since last getting her Nexplanon  removed. Denies heavy menstrual bleeding or excessive menstrual cramps. We discussed that she may have return of heavy irregular bleeding if she were to go back on Nexplanon .   Discussed: - previously referred to [facility] for BTL (doesn't want anymore, now might want more kids in future) - she has no preference for periods or not while on contraception - previously used Paraguard but didn't like because of painful periods - doesn't want pills because she thinks she would miss  doses - not interested in patch or ring - discussed Liletta, including possible improvement of menstrual bleeding; not interested in any IUD  Last sex November 2024 before her husband was deported.  LMP 05/11/2024  Review of Systems  Constitutional:  Negative for fever, malaise/fatigue and weight loss.  Respiratory:  Negative for shortness of breath.   Cardiovascular:  Negative for chest pain and palpitations.   See flowsheet for further details and programmatic requirements Hyperlink available at the top of the signed note in blue.  Flow sheet content below:  Pregnancy Intention Screening Does the patient want to become pregnant in the next year?: No Does the patient's partner want to become pregnant in the next year?: No Would the patient like to discuss contraceptive options today?: Yes  Diabetes screening This patient is 28 y.o. with a BMI of Body mass index is 27.58 kg/m.Sabrina Pace  Is patient eligible for diabetes screening (age >35 and BMI >25)?  no  Was Hgb A1c ordered? not applicable  STI screening Patient reports 1 of partners in last year.  Does this patient desire STI screening?  No - declines  Hepatitis C screening Has patient been screened once for HCV in the past?  No  No results found for: HCVAB  Does the patient meet criteria for HCV testing? No   Hepatitis B screening Does the patient meet criteria for HBV testing? No  Cervical Cancer Screening  Result Date Procedure Results Follow-ups  03/12/2024 IGP, Aptima HPV DIAGNOSIS:: Comment Specimen adequacy:: Comment Clinician Provided ICD10: Comment Performed by:: Comment QC reviewed by:: Comment PAP Smear Comment: . Note:: Comment Test Methodology: CANCELED  HPV Aptima: Negative   10/24/2020 IGP, rfx Aptima HPV ASCU DIAGNOSIS:: Comment Specimen adequacy:: Comment Clinician Provided ICD10: Comment Performed by:: Comment PAP Smear Comment: . Note:: Comment Test Methodology: Comment PAP Reflex: Comment    01/18/2017 HM PAP SMEAR HM Pap smear: Negative    Health Maintenance Due  Topic Date Due   HPV VACCINES (1 - 3-dose SCDM series) Never done   Hepatitis B Vaccines (2 of 3 - 19+ 3-dose series) 06/08/2023   COVID-19 Vaccine (2 - 2024-25 season) 07/24/2023   The following portions of the patient's history were reviewed and updated as appropriate: allergies, current medications, past family history, past medical history, past social history, past surgical history and problem list. Problem list updated.  Objective:   Vitals:   05/29/24 1424  BP: 110/61  Pulse: 76  Weight: 150 lb 12.8 oz (68.4 kg)  Height: 5' 2 (1.575 m)   Physical Exam Vitals and nursing note reviewed.  Constitutional:      Appearance: Normal appearance.  HENT:     Head: Normocephalic.     Mouth/Throat:     Mouth: Mucous membranes are moist.  Cardiovascular:     Rate and Rhythm: Normal rate.  Pulmonary:     Effort: Pulmonary effort is normal.  Genitourinary:    Comments: Declined genital exam- no symptoms, no swabs Musculoskeletal:        General: Normal range of motion.     Cervical back: Neck supple. No rigidity or tenderness.  Lymphadenopathy:     Head:     Right side of head: No submandibular, preauricular or posterior auricular adenopathy.     Left side of head: No submandibular, preauricular or posterior auricular adenopathy.     Cervical: No cervical adenopathy.  Skin:    General: Skin is warm and dry.     Capillary Refill: Capillary refill takes less than 2 seconds.     Coloration: Skin is not jaundiced or pale.     Findings: No bruising, erythema, lesion or rash.  Neurological:     General: No focal deficit present.     Mental Status: She is alert and oriented to person, place, and time.  Psychiatric:        Mood and Affect: Mood normal.        Behavior: Behavior normal.    Procedure:  Nexplanon  Insertion  Patient identified, informed consent performed, consent signed.   Patient does  understand that irregular bleeding is a very common side effect of this medication. She was advised to have backup contraception after placement. Patient was determined to meet WHO criteria for not being pregnant. Appropriate time out taken.  The insertion site was identified 8-10 cm (3-4 inches) from the medial epicondyle of the humerus and 3-5 cm (1.25-2 inches) posterior to (below) the sulcus (groove) between the biceps and triceps muscles of the patient's left arm and marked.  Patient was prepped with alcohol swab and then injected with 5 ml of 1% lidocaine .  Arm was prepped with chlorhexidene, Nexplanon  removed from packaging,  Device confirmed in needle, then inserted full length of needle and withdrawn per handbook instructions. Nexplanon  was able to palpated in the patient's arm; patient palpated the insert herself. There was minimal blood loss.  Patient insertion site covered with guaze and a pressure bandage to reduce any bruising.  The patient tolerated the procedure well and was given post procedure instructions.   Assessment and Plan:  Sabrina Pace is a 28 y.o. female [phone]  presenting to the [facility] Department for an yearly wellness and contraception visit  Contraception counseling:  Reviewed options based on patient desire and reproductive life plan. Patient is interested in Hormonal Implant. This was provided to the patient today.   Risks, benefits, and typical effectiveness rates were reviewed.  Questions were answered.  Written information was also given to the patient to review.    The patient will follow up in  1 years for surveillance.  The patient was told to call with any further questions, or with any concerns about this method of contraception.  Emphasized use of condoms 100% of the time for STI prevention.  Emergency Contraception Precautions (ECP): Patient assessed for need of ECP. She is not a candidate based on no intercourse for 8 months.    Encounter for initial prescription of implantable subdermal contraceptive -     Etonogestrel    No follow-ups on file.  No future appointments.  Betsey CHRISTELLA Helling, MD

## 2024-05-29 NOTE — Progress Notes (Signed)
 Pt is here for Nexplanon  insertion. Nexplanon  inserted successfully today by Macario craven, MD assisted by Damien Satchel, [facility]. Patient tolerated well to insertion with no complication.Opportunity given to patient to ask questions for any clarification. Condoms given. Wilkie Drought, RN.

## 2024-06-27 ENCOUNTER — Ambulatory Visit (LOCAL_COMMUNITY_HEALTH_CENTER): Payer: Self-pay

## 2024-06-27 VITALS — BP 114/56 | Ht 62.0 in | Wt 153.5 lb

## 2024-06-27 DIAGNOSIS — Z3201 Encounter for pregnancy test, result positive: Secondary | ICD-10-CM

## 2024-06-27 DIAGNOSIS — Z3009 Encounter for other general counseling and advice on contraception: Secondary | ICD-10-CM

## 2024-06-27 LAB — PREGNANCY, URINE: Preg Test, Ur: POSITIVE — AB

## 2024-06-27 MED ORDER — PRENATAL 27-0.8 MG PO TABS: 1.0000 | ORAL_TABLET | Freq: Every day | ORAL | Status: AC

## 2024-06-27 NOTE — Progress Notes (Addendum)
 UPT positive. Unsure of plans on continuing pregnancy. Resource lists given to patient.   Patient does currently have hormonal implant. Consulted with JAYSON Helling, MD who advises that if she decides to continue with pregnancy, prenatal care clinic of pt's choice will remove it for her. If patient decides not to continue with pregnancy, she can keep implant and it will work as normal. Patient verbalizes understanding.   The patient was dispensed prenatal vitamins #100 today. I provided counseling today regarding the medication. We discussed the medication, the side effects and when to call clinic.   Positive pregnancy packet reviewed and given to patient. Also counseled on hydration and when to seek medical attention.  Patient given the opportunity to ask questions. Questions answered.   LL interpreter, Salomon [phone], helped during this visit.  Doyce CINDERELLA Shuck, RN

## 2024-07-10 ENCOUNTER — Emergency Department
Admission: EM | Admit: 2024-07-10 | Discharge: 2024-07-10 | Discharge status: Against Medical Advice | Payer: Self-pay | Attending: Emergency Medicine | Admitting: Emergency Medicine

## 2024-07-10 ENCOUNTER — Other Ambulatory Visit: Payer: Self-pay

## 2024-07-10 DIAGNOSIS — R109 Unspecified abdominal pain: Secondary | ICD-10-CM

## 2024-07-10 DIAGNOSIS — R1030 Lower abdominal pain, unspecified: Secondary | ICD-10-CM | POA: Insufficient documentation

## 2024-07-10 DIAGNOSIS — Z5321 Procedure and treatment not carried out due to patient leaving prior to being seen by health care provider: Secondary | ICD-10-CM | POA: Insufficient documentation

## 2024-07-10 NOTE — ED Triage Notes (Signed)
 Pt reports she has had lower abd cramping since yesterday pt denies vaginal bleeding, states she is aprox [redacted] weeks pregnant. Pt has not been seen by OB for this pregnancy. G5P4

## 2024-07-10 NOTE — ED Notes (Signed)
 Pt not in room, pt blood pressure cuff on bed, stickers on bedside table. Md aware

## 2024-07-10 NOTE — ED Notes (Signed)
 Attempted to locate patient- not in the room, called phone without answer.

## 2024-07-10 NOTE — ED Provider Notes (Signed)
 Per triage note, patient presented with lower abdominal cramping without vaginal bleeding.  On chart review, had positive UPT noted at health department earlier this month.  I do not see records of an ultrasound in this pregnancy.  When I went to evaluate the patient, her patient stickers and BP cuff were in the room, but I was not able to find the patient. Checked with RN who has not seen the patient. Did not see patient in the bathroom or when I went back to the room multiple times to check on her.  Attempted to call patient at phone number listed in her chart, but was unable to connect.  Suspect patient eloped without informing staff.  I did not perform an assessment on this patient.   Levander Slate, MD 07/10/24 [phone]

## 2024-07-24 NOTE — Procedures (Signed)
 I chaperoned and assisted during the transvaginal US . Universal protocol was followed. Pt tolerated procedure well. Room was cleaned after dismissal.

## 2024-07-24 NOTE — Procedures (Signed)
 [facility] Informed Consent Form I Sabrina Pace met with Jasamine Aguilar Zavala on 07/24/2024 at time 1033a in-person and provided the patient the information contained in the [facility] consent forms for medical and surgical abortions, including the specific risks and complications of each . The patient signed the [facility] forms.  The patient was provided a copy of the [facility] forms.

## 2024-07-24 NOTE — Progress Notes (Addendum)
 In Person Pregnancy Options Counseling  Referring Provider: Self  SUBJECTIVE   Sabrina Pace is a 28 y.o. H1E4974 at [redacted] weeks gestation, presents today for confirmation of pregnancy and options counseling.    She was seen on 05/29/24 for contraception counseling and had a nexplanon  placed at the time. LMP was 05/11/2024. She then had a positive home and in office UPT 3 wks ago. She denies abuse or coercion. Lives with her mother and children. Currently endorses some nausea but denies VB, fevers or chills.   She is ultimately interested in a BTL, 30 d medicaid form signed today.    Relevant GYN History: Prior Gyn operations: D&C in 2024 History of abnormal pap: denies Last pap normal per patient History of cysts: denies History of endometriosis: denies History of fibroids: denies  OB History  Gravida Para Term Preterm AB Living  8 5 5  2 5   SAB IAB Ectopic Molar Multiple Live Births   2    5    # Outcome Date GA Lbr Len/2nd Weight Sex Type Anes PTL Lv  8 Current           7 IAB 07/27/23          6 Term 06/05/21 [redacted]w[redacted]d / 00:17 3.345 kg (7 lb 6 oz) F Vag-Spont EPI N LIV  5 IAB 11/15/19          4 Term 01/02/19   3.175 kg (7 lb) F Vag-Spont   LIV  3 Term 05/21/17 [redacted]w[redacted]d / 03:25 3.63 kg (8 lb) M Vag-Spont EPI  LIV  2 Term 08/11/15   2.268 kg (5 lb) F Vag-Spont   LIV     Complications: Other Excessive Bleeding, Hemorrhage, PPH (postpartum hemorrhage) (HHS-HCC)  1 Term 07/28/13   3.175 kg (7 lb) F Vag-Spont   LIV     Complications: Other    Obstetric Comments  Required blood transfusion with 2016 delivery   PAST MEDICAL HISTORY:      Patient Active Problem List    Diagnosis Date Noted  . Encounter for sterilization 03/12/2024  . Abnormal uterine bleeding (AUB) 03/12/2024  . Cervical cancer screening 03/12/2024  . Iron  deficiency anemia due to chronic blood loss 03/12/2024  . 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 Latent  TB 03/25/2017    PAST SURGICAL HISTORY:  Past Surgical History:  Procedure Laterality Date  . APPENDECTOMY  2012   MEDICATIONS:  Current Outpatient Medications  Medication Sig Dispense Refill  . ferrous sulfate  325 (65 FE) MG tablet Take 325 mg by mouth 2 (two) times daily with meals. (Patient not taking: Reported on 07/27/2023)    . miSOPROStoL  (CYTOTEC ) 200 MCG tablet Take 2 tablets (400 mcg total) by mouth once for 1 dose 2 tablet 0  . ondansetron  (ZOFRAN ) 4 MG tablet Take 1 tablet (4 mg total) by mouth every 8 (eight) hours as needed for Nausea for up to 7 days 20 tablet 0  . prenatal vit-iron  fum-folic ac (PRENAVITE) tablet Take 1 tablet by mouth once daily. (Patient not taking: Reported on 07/27/2023)     No current facility-administered medications for this visit.   ALLERGIES: Patient has no known allergies.  SOCIAL HISTORY:  Social History   Tobacco Use  . Smoking status: Never  . Smokeless tobacco: Never  Substance Use Topics  . Alcohol use: No  . Drug use: No   Lives with: mother  OBJECTIVE   BP 114/62  Pulse 82   Temp 37 C (98.6 F) (Oral)   Resp 14   Ht 152.4 cm (5')   Wt 69.6 kg (153 lb 7 oz)   LMP 05/11/2024   BMI 29.97 kg/m    Exam: Physical Exam: General Appearance / Mental Status: well appearing in no acute distress / normal mood and affect Cardiovascular : regular rate and rhythm Respiratory : normal effort of breathing Abdomen : soft, nontender, nondistended. No masses or hernia noted, no obvious abnormalities of liver or spleen  Pelvic exam:   External genitalia: normal, without lesion and no inguinal lymphadenopathy Vagina: normal without lesions or abnormal discharge  Labs: A Positive Lab Results  Component Value Date   WBC 9.0 02/15/2017   HGB 10.0 (L) 02/15/2017   HCT 30.1 (L) 07/24/2024   MCV 90 02/15/2017   PLT 254 02/15/2017    Imaging: LIMITED OBSTETRIC ULTRASOUND PROCEDURE NOTE Indication: pregnancy dating Procedure: Limited  obstetric ultrasound was performed using vaginal probe.  Findings: Intrauterine gestational sac seen: yes Gestational sac summary: fetal cardiac activity Fetal cardiac activity: present Crown-rump length: consistent with 10 weeks and 3 days   ASSESSMENT/PLAN   Surgical Abortion Patient Examination I met with Kurstyn Josselin Aguilar Pace on 07/24/2024 at time 1000 AM and verified pregnancy by TVUS. The patient's probable gestational age is [redacted]w[redacted]d with LMP consistent with US  today.  Gestational age was determined by LMP.  I determined the patient's Rh status to be A Positive. The patient's screen for coercion and abuse was negative.   Today, we reviewed options moving forward including continuing the pregnancy or proceeding with abortion.  After discussion of her options, she has decided to proceed with Surgical abortion.  [facility] Informed Consent Form Rosaline Monte, RN met with Jaskiran Josselin Aguilar Pace on 07/24/2024 at time 1030 AM in-person and provided the patient the information contained in the [facility] consent form for medical and surgical abortions, including the specific risks and complications of each . The patient signed the [facility] forms.  The patient was provided a copy of the [facility] form.  Patient was provided counseling regarding Rh testing and administration of Rho(D) immune globulin in the setting of early spontaneous and induced abortion. We discussed that society guidelines, including the [employer] of Obstetricians and Gynecologists, the Society for [employer], the Society for Maternal Fetal Medicine, and the National Abortion Federation differ slightly in their recommendations regarding whether women are testing for Rh status or administered the Rho(D) immune globulin. We discussed the risks of Rh-D alloimmunization in future pregnancies with the spectrum of hemolytic disease of the fetus and newborn. We discussed that most research suggests that fetal-maternal  hemorrhage before 12 weeks is exceedingly rare, and few reported cases have shown a volume of fetal Rh-D positive cells above the threshold for sensitization, including no cases of new sensitization in a recent cohort of 506 women undergoing both medication and procedural abortion prior to 12 weeks abortion. We further discussed the financial and logistical barriers to Rh(D) immune globulin administration, including a national and international Rh(D) immune globulin shortage.   Using shared decision making, the patient has decided on Rh testing and administration of Rho(D) immune globulin if patient is found to be Rh negative: YES NO: no  Contraception: Discussed with patient that likely had luteal phase pregnancy when she had her Nexplanon  placed as opposed to Nexplanon  failure. Patient still desires to take Nexplanon  out and proceed with tubal ligation. Medicaid paperwork signed today, patient applying for DFA.  Discussed that tubal ligation should be considered permanent; patient has considered another pregnancy with her partner. Patient continuing to think about options, consider SWU at the time of post-procedure visit if patient ready to proceed.  1. Encounter for pregnancy test, result positive (HHS-HCC) -     US  POC obstetric images; Future -     Hematocrit; Future -     ondansetron  (ZOFRAN ) 4 MG tablet; Take 1 tablet (4 mg total) by mouth every 8 (eight) hours as needed for Nausea for up to 7 days  Dispense: 20 tablet; Refill: 0 -     miSOPROStoL  (CYTOTEC ) 200 MCG tablet; Take 2 tablets (400 mcg total) by mouth once for 1 dose  Dispense: 2 tablet; Refill: 0  2. Contraceptive education    Follow up:  Patient will return after 72 hours for her next clinic appointment  Lonell Che, MD  Obstetrics and Gynecology, PGY-2 Pager: [phone]  Jump to abortion navigator <redacted file path>   Attestation Statement:   I personally saw and evaluated the patient, and participated in the management  and treatment plan as documented in the resident/fellow note.  JILL MELISSA HAGEY, MD

## 2024-08-01 ENCOUNTER — Ambulatory Visit: Payer: Self-pay

## 2024-08-01 DIAGNOSIS — Z113 Encounter for screening for infections with a predominantly sexual mode of transmission: Secondary | ICD-10-CM

## 2024-08-01 DIAGNOSIS — N949 Unspecified condition associated with female genital organs and menstrual cycle: Secondary | ICD-10-CM

## 2024-08-01 LAB — WET PREP FOR TRICH, YEAST, CLUE
Clue Cell Exam: POSITIVE — AB
Trichomonas Exam: NEGATIVE
Yeast Exam: NEGATIVE

## 2024-08-01 LAB — HM HIV SCREENING LAB: HM HIV Screening: NEGATIVE

## 2024-08-01 MED ORDER — ACYCLOVIR 400 MG PO TABS: 400.0000 mg | ORAL_TABLET | Freq: Three times a day (TID) | ORAL | Status: AC

## 2024-08-01 NOTE — Progress Notes (Addendum)
 Pt is here for an STD visit. Wet prep results reviewed with patient. The patient was dispensed Acyclovir  400 mg Tablets 3x/day for 10 days . I provided counseling today regarding the medication, the side effects and when to call clinic. Patient given the opportunity to ask questions for any clarifications. Questions answered. Condoms and brochure given. Wilkie Drought, RN.

## 2024-08-01 NOTE — Progress Notes (Signed)
 [facility] Department STI clinic [address] [place] [address] Main phone: [phone]  STI screening visit  Subjective:  Sabrina Pace is a 28 y.o. female being seen today for an STI screening visit. The patient reports they do have symptoms.  Patient's last menstrual period was 05/11/2024 (exact date).  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 Has symptoms: she reports burning of her rectum for several days, used a hemorrhoid cream a few days ago because she suspected her hemorrhoids were bothering her. No rectal bleeding. No vaginal discharge, abdominal pain, vaginal bleeding.   Patient reports that they are pregnant. Had a Nexplanon  placed on 05/29/24 and had positive pregnancy test on 06/27/24. Went to ED on 07/10/24 with abdominal cramping/no bleeding but she left before being evaluated. Sought consult on 07/24/24 for pregnancy termination and patient was found to be [redacted]w[redacted]d.  Has her an appointment for pregnancy termination on Monday. Desires comprehensive STI testing.  See flowsheet for further details and programmatic requirements Hyperlink available at the top of the signed note in blue.  Flow sheet content below:  Pregnancy Intention Screening Does the patient want to become pregnant in the next year?: (P)  (PT is pregnant) Does the patient's partner want to become pregnant in the next year?: (P) N/A Would the patient like to discuss contraceptive options today?: (P) N/A STD Symptoms Denies all:  No Genital Itching: No Lower abdominal pain: No Discharge: No Dysuria: No Genital ulcer / lesion: No Rash: No Vaginal irritation: No Oral / Other skin ulcer: No Pain with sex: No Sore Throat: No Visual Changes: No Vaginal Bleeding: No Other (Describe in Comments):  (rectal burning) Risk Factors for Hep B Household, sexual, or needle sharing contact of a person infected with Hep B: No Sexual contact with a person who uses drugs not as prescribed?: No Currently or Ever used drugs not as prescribed: No HIV Positive: No PRep Patient: No Men who have sex with men: No Have Hepatitis C: No History of Incarceration: No History of Homeslessness?: No Anal sex following anal drug use?: No Risk Factors for Hep C Currently using drugs not as prescribed: No Sexual partner(s) currently using drugs as not prescribed: No History of drug use: No HIV Positive: No People with a history of incarceration: No People born between the years of 66 and 63: No Abuse History Has patient ever been abused physically?: No Has patient ever been abused sexually?: No Does patient feel they have a problem with Anxiety?: No Does patient feel they have a problem with Depression?: No   Screening for MPX risk: Does the patient have an unexplained rash? No Is the patient MSM? No Does the patient endorse multiple sex partners or anonymous sex partners? No Did the patient have close or sexual contact with a person diagnosed with MPX? No Has the patient traveled outside the [place]  where MPX is endemic? No Is there a high clinical suspicion for MPX-- evidenced by one of the following No  -Unlikely to be chickenpox  -Lymphadenopathy  -  Rash that present in same phase of evolution on any given body part  Screenings: Last HIV test per patient/review of record was  Lab Results  Component Value Date   HMHIVSCREEN Negative - Validated 10/10/2018    Lab Results  Component Value Date   HIV Non Reactive 10/24/2020     Last HEPC test per patient/review of record was No results found for: HMHEPCSCREEN No components found for: HEPC   Last HEPB test per patient/review of record was No components found for: HMHEPBSCREEN   Patient reports last pap was:   Lab Results  Component Value Date   SPECADGYN Comment 03/12/2024   Result Date Procedure Results Follow-ups  03/12/2024 IGP, Aptima HPV DIAGNOSIS:: Comment Specimen adequacy:: Comment Clinician Provided ICD10: Comment Performed by:: Comment QC reviewed by:: Comment PAP Smear Comment: . Note:: Comment Test Methodology: CANCELED HPV Aptima: Negative   10/24/2020 IGP, rfx Aptima HPV ASCU DIAGNOSIS:: Comment Specimen adequacy:: Comment Clinician Provided ICD10: Comment Performed by:: Comment PAP Smear Comment: . Note:: Comment Test Methodology: Comment PAP Reflex: Comment   01/18/2017 HM PAP SMEAR HM Pap smear: Negative    Immunization history:  Immunization History  Administered Date(s) Administered   Hep A / Hep B 05/11/2023   IPV 05/11/2023   Influenza, Quadrivalent, Recombinant, Inj, Pf 11/20/2020   Influenza,inj,Quad PF,6+ Mos 05/11/2023   MMR 05/11/2023   Pfizer(Comirnaty)Fall Seasonal Vaccine 12 years and older 05/11/2023   Tdap 05/18/2021    The following portions of the patient's history were reviewed and updated as appropriate: allergies, current medications, past medical history, past social history, past surgical history and problem list.  Objective:  There were no vitals filed for this visit.  Physical Exam Vitals and nursing note reviewed. Exam conducted with a chaperone present Brett Orange).  Constitutional:      Appearance: Normal appearance.  HENT:     Head: Normocephalic and atraumatic.     Mouth/Throat:     Mouth: Mucous membranes are moist.     Pharynx: Oropharynx is clear. No oropharyngeal exudate or posterior oropharyngeal erythema.  Pulmonary:     Effort: Pulmonary effort is normal.  Genitourinary:     Exam position: Lithotomy position.     Pubic Area: No rash or pubic lice.      Labia:        Right: No rash or lesion.        Left: No rash or lesion.      Vagina: Tenderness and lesions present. No vaginal discharge, erythema or bleeding.     Rectum: Normal. No external hemorrhoid.      Comments: Red dots represents small vulvar blisters. Larger circle is an larger open lesion.  Did not use a speculum for her swabs due to tenderness of the exam. Lymphadenopathy:     Cervical: No cervical adenopathy.     Right cervical: No superficial or posterior cervical adenopathy.    Left cervical: No superficial or posterior cervical adenopathy.     Upper Body:     Right upper body: No supraclavicular adenopathy.     Left upper body: No supraclavicular adenopathy.  Skin:    General: Skin is warm and dry.     Findings: Rash present. Rash is vesicular.     Comments: Vesicular rash of vulva  Neurological:     Mental Status: She is alert and oriented to person, place, and time.    Assessment and Plan:  Reather Josselin Aguilar Pace is a 28 y.o. female presenting to the  [facility] Department for STI screening  1. Screening for venereal disease (Primary)  - Chlamydia/Gonorrhea [place] [facility] - HIV [place] [facility] - Syphilis Serology, [place] [facility] - WET PREP FOR TRICH, YEAST, CLUE   1. Screening for venereal disease (Primary)  - Chlamydia/Gonorrhea [place] [facility] - HIV [place] [facility] - Syphilis Serology, [place] [facility] - WET PREP FOR TRICH, YEAST, CLUE  2. Genital lesion, female  - Multiple blisters on perineal area/below introitus. Exquisitely tender on exam and with HSV swab. Was able to break a blister for the HSV swab. One 2cm area of broken skin that likely is erupted vesicles. Has been with her new partner for 11 months. No history of HSV per pt, she is not aware of her partner having a history of genital herpes.  - acyclovir  (ZOVIRAX ) 400 MG tablet; Take 1 tablet (400 mg total) by  mouth 3 (three) times daily for 10 days.  - Virology, [place] [facility]   Patient accepted the following screenings: vaginal CT/GC swab, vaginal wet prep, HIV, and RPR Patient meets criteria for HepB screening? No. Ordered? no Patient meets criteria for HepC screening? No. Ordered? no  Treat wet prep per standing order Discussed time line for State Lab results and that patient will be called with positive results and encouraged patient to call if she had not heard in 2 weeks.  Counseled to return or seek care for continued or worsening symptoms Recommended repeat testing in 3 months with positive results. Recommended condom use with all sex for STI prevention.   Patient is currently using *Nexplanon  to prevent pregnancy - however she is currently [redacted]w[redacted]d pregnant and has termination appointment on Monday with [facility].  Return if symptoms worsen or fail to improve.  No future appointments.  Damien FORBES Satchel, NP  Due to language barrier, a Spanish interpreter [place], [place] [id number]) was present by phone during the history-taking, subsequent discussion, and physical exam with this patient.

## 2024-08-02 LAB — VIROLOGY, ~~LOC~~ LAB
HSV 1 DNA: NEGATIVE
HSV 2 , PCR: POSITIVE
Varicella-Zoster, PCR: NEGATIVE

## 2024-08-03 ENCOUNTER — Telehealth: Payer: Self-pay

## 2024-08-03 NOTE — Telephone Encounter (Addendum)
 The state lab called with the HSV test results since this is a pregnant pt. 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   Currently seeing [facility] for prenatal care.

## 2024-08-06 ENCOUNTER — Ambulatory Visit: Payer: Self-pay

## 2024-08-06 NOTE — Progress Notes (Signed)
 Attempted to call Ms. Aguilar Zavala regarding her positive HSV-2 results. Left voicemail with assistance of a Spanish interpreter. Left call back number for [facility]. She is already on acyclovir .  Damien Satchel [facility]

## 2024-08-06 NOTE — Progress Notes (Signed)
 Sent misoprostol  for upcoming procedure.  Jaxon Olsen, MD Obstetrics and Gynecology, PGY-4 Pager: [phone]

## 2024-08-07 NOTE — Progress Notes (Signed)
 Attempted x2 to reach Sabrina Pace by phone to discuss positive HSV-2 result. Left voicemail for her to return call to [facility] Department.  Damien Satchel [facility]

## 2024-08-08 NOTE — Progress Notes (Signed)
 Attempt x3 to contact Sabrina Pace regarding her positive HSV-2 results. Left voicemail w/ Spanish interpreter to contact [facility] regarding her test results.  Damien Satchel [facility]

## 2024-08-13 NOTE — Telephone Encounter (Signed)
  Damien FORBES Satchel, NP 08/08/2024  9:56 AM EDT Back to Top    Attempt x3 to contact Ms. Aguilar Zavala regarding her positive HSV-2 results. Left voicemail w/ Spanish interpreter to contact [facility] regarding her test results.  Damien Satchel [facility]

## 2024-08-14 NOTE — Telephone Encounter (Signed)
 Phone call to [phone]. Received message stating your request cannot be processed.  Tried twice.  MyChart is pending.  Phone call to alternative number listed in pt chart, [phone].  Linnea, pt's sister answered. Sister stated she would have pt call nurse at [phone].

## 2024-08-15 NOTE — Telephone Encounter (Signed)
 08/15/24 mailed letter re important health matter, please contact [facility].

## 2024-09-26 ENCOUNTER — Ambulatory Visit: Payer: Self-pay

## 2024-09-26 VITALS — BP 109/71 | HR 75 | Ht 61.0 in | Wt 154.8 lb

## 2024-09-26 DIAGNOSIS — N921 Excessive and frequent menstruation with irregular cycle: Secondary | ICD-10-CM

## 2024-09-26 DIAGNOSIS — A6000 Herpesviral infection of urogenital system, unspecified: Secondary | ICD-10-CM

## 2024-09-26 DIAGNOSIS — Z3009 Encounter for other general counseling and advice on contraception: Secondary | ICD-10-CM

## 2024-09-26 DIAGNOSIS — Z975 Presence of (intrauterine) contraceptive device: Secondary | ICD-10-CM

## 2024-09-26 MED ORDER — NORGESTIMATE-ETH ESTRADIOL 0.25-35 MG-MCG PO TABS: 1.0000 | ORAL_TABLET | Freq: Every day | ORAL | 0 refills | Status: DC

## 2024-09-26 MED ORDER — ACYCLOVIR 400 MG PO TABS: 800.0000 mg | ORAL_TABLET | Freq: Every day | ORAL | Status: AC

## 2024-09-26 NOTE — Progress Notes (Signed)
 Pt is here for an acute visit. The patient was dispensed ortho cyclen #1 pack and Acyclovir  800 mg 2x/day for 5 days today. I provided counseling today regarding the medication. We discussed the medication, the side effects and when to call clinic. Patient given the opportunity to ask questions for any clarifications. Questions answered. Condoms declined. Wilkie Drought, RN.

## 2024-09-26 NOTE — Progress Notes (Signed)
 [facility] [address] [place] [address] Main phone: [phone]  Women's Health Problem Visit  Subjective:  Sabrina Pace is a 28 y.o. being seen today for   Chief Complaint  Patient presents with   Contraception   HPI: Patient reports a lot of bleeding with her Nexplanon . Has bleeding every day, sometimes a small amount and sometimes a lot. When she bleeds a lot she wears a pad and tampon. Last 2 weeks, a lot of bleeding. Nexplanon  was placed in July, had a surgical abortion on 08/10/24. Heavy bleeding started 2 weeks ago. Has cramping. She also had a prior Nexplanon  where she had bleeding issues. When Nexplanon  was placed in July, was counseled regarding bleeding as a side effect.  She wants to keep her implant but wants help with her bleeding.  Health Maintenance Due  Topic Date Due   HPV VACCINES (1 - 3-dose SCDM series) Never done   Hepatitis B Vaccines 19-59 Average Risk (2 of 3 - 19+ 3-dose series) 06/08/2023   Influenza Vaccine  06/22/2024   COVID-19 Vaccine (2 - 2025-26 season) 07/23/2024   Review of Systems  All other systems reviewed and are negative.  The following portions of the patient's history were reviewed and updated as appropriate: allergies, current medications, past family history, past medical history, past social history, past surgical history and problem list. Problem list updated.  See flowsheet for other program required questions.  Objective:   Vitals:   09/26/24 1031  BP: 109/71  Pulse: 75  Weight: 154 lb 12.8 oz (70.2 kg)  Height: 5' 1 (1.549 m)   Physical Exam Constitutional:      Appearance: Normal appearance.  HENT:     Head: Normocephalic.     Mouth/Throat:     Mouth: Mucous membranes are moist.  Eyes:     General: No scleral icterus.       Right eye: No discharge.        Left eye: No discharge.  Pulmonary:     Effort: Pulmonary effort  is normal.  Skin:    General: Skin is warm and dry.  Neurological:     General: No focal deficit present.     Mental Status: She is alert.  Psychiatric:        Mood and Affect: Mood normal.        Behavior: Behavior normal.     Assessment and Plan:  Sabrina Pace is a 28 y.o. female presenting to the [facility] Department for a Women's Health problem visit  Patient with current LARC device complaining of irregular bleeding. Has a Nexplanon . Counseled on options which include watchful waiting, scheduled NSAIDs, OCP for limited time, removal. Counseled on the the normality of bleeding with method and typical bleeding patterns with LARC method.   1. Breakthrough bleeding on Nexplanon  (Primary)  - Has been bleeding daily for 2 weeks. Had an abortion back on 08/10/24.  - Bleeding could possibly be her first menstrual period since her surgical abortion - No history of migraine with aura, DVT/PE, breast cancer, BP today is 109/71. - Trial of 1 month of combined OCPs for bleeding cessation - norgestimate -ethinyl estradiol  (ORTHO-CYCLEN) 0.25-35 MG-MCG tablet; Take 1 tablet by mouth daily.  Dispense: 28 tablet; Refill: 0  - Follow up after 1 month of oral contraceptive pills to assess bleeding  2. Genital herpes simplex, unspecified site  - She was given Valtrex by family  planning provider at her abortion appt - No symptoms since initial outbreak - Would like medication to have on hand if symptoms return - acyclovir  (ZOVIRAX ) 400 MG tablet; Take 2 tablets (800 mg total) by mouth 5 (five) times daily for 5 days.   Due to language barrier, a Spanish interpreter Kemp T.) was present in person during the history-taking, subsequent discussion, and physical exam with this patient.    Return in about 4 weeks (around 10/24/2024).  No future appointments.  Damien FORBES Satchel, NP

## 2024-10-24 ENCOUNTER — Ambulatory Visit: Payer: Self-pay

## 2024-10-30 ENCOUNTER — Encounter: Payer: Self-pay | Admitting: Family Medicine

## 2024-10-30 ENCOUNTER — Ambulatory Visit: Payer: Self-pay

## 2024-10-30 ENCOUNTER — Ambulatory Visit: Payer: Self-pay | Admitting: Family Medicine

## 2024-10-30 VITALS — BP 120/52 | HR 76 | Wt 148.4 lb

## 2024-10-30 DIAGNOSIS — N921 Excessive and frequent menstruation with irregular cycle: Secondary | ICD-10-CM | POA: Insufficient documentation

## 2024-10-30 DIAGNOSIS — Z3009 Encounter for other general counseling and advice on contraception: Secondary | ICD-10-CM

## 2024-10-30 DIAGNOSIS — Z30011 Encounter for initial prescription of contraceptive pills: Secondary | ICD-10-CM | POA: Insufficient documentation

## 2024-10-30 DIAGNOSIS — Z3049 Encounter for surveillance of other contraceptives: Secondary | ICD-10-CM

## 2024-10-30 DIAGNOSIS — A6004 Herpesviral vulvovaginitis: Secondary | ICD-10-CM

## 2024-10-30 DIAGNOSIS — Z975 Presence of (intrauterine) contraceptive device: Secondary | ICD-10-CM

## 2024-10-30 MED ORDER — NORGESTIMATE-ETH ESTRADIOL 0.25-35 MG-MCG PO TABS: 1.0000 | ORAL_TABLET | Freq: Every day | ORAL | Status: AC

## 2024-10-30 NOTE — Assessment & Plan Note (Signed)
 Patient reports no further outbreaks since initial treatment. Has PRN meds on hand.

## 2024-10-30 NOTE — Patient Instructions (Addendum)
 I translated the following text using Google translate.  Please excuse any errors.  He traducido el siguiente texto con el traductor de Microbiologist. Disculpe cualquier error. Nexplanon  removal  Today we removed your Nexplanon  implant.  Extraccin de Nexplanon  Hoy le extrajimos el implante Nexplanon .  After care: Women may have discomfort and some bruising following the removal of Nexplanon . Wear your pressure bandage for a full 24 hours, then an adhesive bandage for 3-5 days. Allow the wound closure strips to fall off naturally with routine showering - do not remove forcefully.  Cuidados posteriores: Es posible que sienta molestias y algunos moretones tras la extraccin de Nexplanon . Use la venda compresiva durante 24 horas y, posteriormente, una venda adhesiva durante 3 a 5 das. Deje que las tiras de cierre de la herida se desprendan naturalmente con la ducha habitual; no las retire con pensions consultant.  Reasons to seek care: Fever and chills Swelling and redness of the Nexplanon  site Abnormal or foul drainage from the Nexplanon  removal site Razones para buscar atencin mdica: Fiebre y escalofros Hinchazn y enrojecimiento de la zona de Nexplanon  Supuracin anormal o ftida de la zona de extraccin de Nexplanon   Contraception:  You may become pregnant as early as a week after the removal of Nexplanon . After Nexplanon  is removed, and if you do not wish to get pregnant at this time, you should start another birth control method, such as condoms, right away. If you want to get pregnant, please consult with your doctor.  Anticoncepcin: Puede quedar embarazada tan solo una semana despus de la extraccin de Nexplanon . Despus de la extraccin de Nexplanon , y si no desea quedar embarazada en ese momento, debe comenzar de inmediato con otro mtodo anticonceptivo, como el preservativo. Si desea quedar embarazada, consulte con su mdico.

## 2024-10-30 NOTE — Progress Notes (Signed)
 [facility] [address] [place] [address] Main phone: [phone]  Women's Health Problem Visit   Subjective:  Sabrina Pace is a 28 y.o. being seen today for Nexplanon  removal.   Chief Complaint  Patient presents with   Acute Visit    Nexplanon  removal and wants pills   HPI - Nexplanon  history below  07/27/2023 @ [facility] - Nexplanon  placed 03/12/2024 @ [facility] - Nexplanon  removed due to heavy irregular bleeding  05/29/2024 @[facility]  - Nexplanon  replaced after extensive discussion: - previously referred to [facility] for BTL (doesn't want anymore, now might want more kids in future) - she has no preference for periods or not while on contraception - previously used Paraguard but didn't like because of painful periods - doesn't want pills because she thinks she would miss doses - not interested in patch or ring - discussed Liletta, including possible improvement of menstrual bleeding; not interested in any IUD - reports last sex November 2024 before her husband was deported.  06/27/2024 @ [facility] - positive pregnancy test 07/24/2024 @ [facility] - medical IAB 08/10/2024 @ [facility] - surgical IAB, noted Family planning- in person f/u and SWU for BTL 09/26/2024 @ [facility] - c/o break through bleeding x 2 weeks, prescribed COC x 1 month  Here today to have Nexplanon  removed and switch over the pills only. Discussed depo provera , IUD, and BTL but patient not interested in those at this time. Reports previous adverse side effects with depo and IUD.   She does not want permanent contraception at this time - she may want children in the future.   No contraindications to estrogen: no history of HTN, DVT, PE, stroke, migraine with aura, congenital clotting disorders, or smoking.  Health Maintenance Due  Topic Date Due   HPV VACCINES (1 - 3-dose SCDM series) Never done   Hepatitis B Vaccines 19-59 Average Risk (2 of 3 -  19+ 3-dose series) 06/08/2023   Influenza Vaccine  06/22/2024   COVID-19 Vaccine (2 - 2025-26 season) 07/23/2024   Review of Systems  Constitutional:  Negative for fever, malaise/fatigue and weight loss.  Respiratory:  Negative for shortness of breath.   Cardiovascular:  Negative for chest pain and palpitations.   The following portions of the patient's history were reviewed and updated as appropriate: allergies, current medications, past family history, past medical history, past social history, past surgical history and problem list. Problem list updated.  See flowsheet for other program required questions.  Objective:   Vitals:   10/30/24 1104  BP: (!) 120/52  Pulse: 76  Weight: 148 lb 6.4 oz (67.3 kg)   Physical Exam Constitutional:      Appearance: Normal appearance.  HENT:     Head: Normocephalic and atraumatic.  Pulmonary:     Effort: Pulmonary effort is normal.  Abdominal:     Palpations: Abdomen is soft.  Musculoskeletal:        General: Normal range of motion.  Skin:    General: Skin is warm and dry.  Neurological:     General: No focal deficit present.     Mental Status: She is alert.  Psychiatric:        Mood and Affect: Mood normal.        Behavior: Behavior normal.    Procedure:  Nexplanon  Removal Patient identified, informed consent performed, consent signed.   Appropriate time out taken. Nexplanon  site identified in the patient's left arm.  Area prepped in  usual sterile fashon. 3 ml of 1% lidocaine  with Epinephrine  was used to anesthetize the area at the distal end of the implant and along implant site. A small stab incision was made right beside the implant on the distal portion.  The Nexplanon  rod was grasped using hemostats and removed without difficulty.  There was minimal blood loss. There were no complications.  Steri-strips were applied over the small incision.  A pressure bandage was applied to reduce any bruising.  The patient tolerated the  procedure well and was given post procedure instructions.    Assessment and Plan:  Sabrina Pace is a 28 y.o. female presenting to the [facility] Department for a Women's Health problem visit  Encounter for initial prescription of contraceptive pills Assessment & Plan: Extensively counseled on her options including depo provera , IUD, and BTL. She strongly desires COCs at this time. No contraindications to estrogen at this time. Dispensed 3 months of COC, will return for refills. Counseled to avoid sex for 7 days.   Orders: -     Norgestimate -Eth Estradiol ; Take 1 tablet by mouth daily.  Breakthrough bleeding on Nexplanon  Assessment & Plan: Nexplanon  removed today (10/30/2024) due to break through bleeding. See procedure note for more.    Herpes simplex vulvovaginitis Assessment & Plan: Patient reports no further outbreaks since initial treatment. Has PRN meds on hand.     No follow-ups on file.  No future appointments.  Betsey CHRISTELLA Helling, MD

## 2024-10-30 NOTE — Assessment & Plan Note (Signed)
 Nexplanon  removed today (10/30/2024) due to break through bleeding. See procedure note for more.

## 2024-10-30 NOTE — Progress Notes (Addendum)
 Pt is here for Nexplanon  removal. Nexplanon  removed successfully from the arm by Macario Craven, MD., and pt tolerated well to the removal process with no complications.  The patient was dispensed ortho cyclen #3 packs. I provided counseling regarding the medication, the side effects and when to call clinic. Opportunity given to pt to ask questions for any clarifications. Questions Answered.Condoms declined Wilkie Drought, [employer].

## 2024-10-30 NOTE — Assessment & Plan Note (Signed)
 Extensively counseled on her options including depo provera , IUD, and BTL. She strongly desires COCs at this time. No contraindications to estrogen at this time. Dispensed 3 months of COC, will return for refills. Counseled to avoid sex for 7 days.

## 2024-11-13 ENCOUNTER — Other Ambulatory Visit: Payer: Self-pay

## 2024-11-13 ENCOUNTER — Emergency Department
Admission: EM | Admit: 2024-11-13 | Discharge: 2024-11-14 | Disposition: A | Discharge status: Home / Self Care | Payer: MEDICAID

## 2024-11-13 ENCOUNTER — Encounter: Payer: Self-pay | Admitting: Emergency Medicine

## 2024-11-13 DIAGNOSIS — R1013 Epigastric pain: Secondary | ICD-10-CM | POA: Insufficient documentation

## 2024-11-13 DIAGNOSIS — D649 Anemia, unspecified: Secondary | ICD-10-CM | POA: Insufficient documentation

## 2024-11-13 DIAGNOSIS — E876 Hypokalemia: Secondary | ICD-10-CM | POA: Insufficient documentation

## 2024-11-13 DIAGNOSIS — R112 Nausea with vomiting, unspecified: Secondary | ICD-10-CM

## 2024-11-13 DIAGNOSIS — J101 Influenza due to other identified influenza virus with other respiratory manifestations: Secondary | ICD-10-CM | POA: Insufficient documentation

## 2024-11-13 LAB — URINALYSIS, ROUTINE W REFLEX MICROSCOPIC
Bacteria, UA: NONE SEEN
Bilirubin Urine: NEGATIVE
Glucose, UA: NEGATIVE mg/dL
Ketones, ur: 20 mg/dL — AB
Leukocytes,Ua: NEGATIVE
Nitrite: NEGATIVE
Protein, ur: 100 mg/dL — AB
Specific Gravity, Urine: 1.042 — ABNORMAL HIGH (ref 1.005–1.030)
pH: 5 (ref 5.0–8.0)

## 2024-11-13 LAB — CBC WITH DIFFERENTIAL/PLATELET
Abs Immature Granulocytes: 0.02 K/uL (ref 0.00–0.07)
Basophils Absolute: 0 K/uL (ref 0.0–0.1)
Basophils Relative: 0 %
Eosinophils Absolute: 0.1 K/uL (ref 0.0–0.5)
Eosinophils Relative: 1 %
HCT: 29.5 % — ABNORMAL LOW (ref 36.0–46.0)
Hemoglobin: 9.1 g/dL — ABNORMAL LOW (ref 12.0–15.0)
Immature Granulocytes: 0 %
Lymphocytes Relative: 8 %
Lymphs Abs: 0.8 K/uL (ref 0.7–4.0)
MCH: 21.9 pg — ABNORMAL LOW (ref 26.0–34.0)
MCHC: 30.8 g/dL (ref 30.0–36.0)
MCV: 71.1 fL — ABNORMAL LOW (ref 80.0–100.0)
Monocytes Absolute: 0.6 K/uL (ref 0.1–1.0)
Monocytes Relative: 6 %
Neutro Abs: 7.8 K/uL — ABNORMAL HIGH (ref 1.7–7.7)
Neutrophils Relative %: 85 %
Platelets: 435 K/uL — ABNORMAL HIGH (ref 150–400)
RBC: 4.15 MIL/uL (ref 3.87–5.11)
RDW: 17 % — ABNORMAL HIGH (ref 11.5–15.5)
WBC: 9.3 K/uL (ref 4.0–10.5)
nRBC: 0 % (ref 0.0–0.2)

## 2024-11-13 LAB — POC URINE PREG, ED: Preg Test, Ur: NEGATIVE

## 2024-11-13 MED ORDER — ONDANSETRON 4 MG PO TBDP
4.0000 mg | ORAL_TABLET | Freq: Once | ORAL | Status: AC
  Administered 2024-11-13: 4 mg via ORAL
  Filled 2024-11-13: qty 1

## 2024-11-13 NOTE — ED Triage Notes (Signed)
Patient ambulatory to triage with steady gait, without difficulty or distress noted; pt reports x 2 days having N/V and generalized abd pain

## 2024-11-14 ENCOUNTER — Emergency Department: Payer: MEDICAID

## 2024-11-14 LAB — LIPASE, BLOOD: Lipase: 15 U/L (ref 11–51)

## 2024-11-14 LAB — COMPREHENSIVE METABOLIC PANEL WITH GFR
ALT: 12 U/L (ref 0–44)
AST: 15 U/L (ref 15–41)
Albumin: 4.5 g/dL (ref 3.5–5.0)
Alkaline Phosphatase: 77 U/L (ref 38–126)
Anion gap: 14 (ref 5–15)
BUN: 8 mg/dL (ref 6–20)
CO2: 22 mmol/L (ref 22–32)
Calcium: 8.6 mg/dL — ABNORMAL LOW (ref 8.9–10.3)
Chloride: 104 mmol/L (ref 98–111)
Creatinine, Ser: 0.62 mg/dL (ref 0.44–1.00)
GFR, Estimated: >60 mL/min
Glucose, Bld: 136 mg/dL — ABNORMAL HIGH (ref 70–99)
Potassium: 3 mmol/L — ABNORMAL LOW (ref 3.5–5.1)
Sodium: 140 mmol/L (ref 135–145)
Total Bilirubin: <0.2 mg/dL (ref 0.0–1.2)
Total Protein: 7.9 g/dL (ref 6.5–8.1)

## 2024-11-14 LAB — RESP PANEL BY RT-PCR (RSV, FLU A&B, COVID)  RVPGX2
Influenza A by PCR: NEGATIVE
Influenza B by PCR: POSITIVE — AB
Resp Syncytial Virus by PCR: NEGATIVE
SARS Coronavirus 2 by RT PCR: NEGATIVE

## 2024-11-14 MED ORDER — DROPERIDOL 2.5 MG/ML IJ SOLN
1.2500 mg | Freq: Once | INTRAMUSCULAR | Status: AC
  Administered 2024-11-14: 1.25 mg via INTRAVENOUS
  Filled 2024-11-14: qty 2

## 2024-11-14 MED ORDER — POTASSIUM CHLORIDE CRYS ER 20 MEQ PO TBCR
40.0000 meq | EXTENDED_RELEASE_TABLET | Freq: Once | ORAL | Status: AC
  Administered 2024-11-14: 40 meq via ORAL
  Filled 2024-11-14: qty 2

## 2024-11-14 MED ORDER — FAMOTIDINE IN NACL 20-0.9 MG/50ML-% IV SOLN
20.0000 mg | Freq: Once | INTRAVENOUS | Status: AC
  Administered 2024-11-14: 20 mg via INTRAVENOUS
  Filled 2024-11-14: qty 50

## 2024-11-14 MED ORDER — LORAZEPAM 2 MG/ML IJ SOLN
0.2500 mg | Freq: Once | INTRAMUSCULAR | Status: AC
  Administered 2024-11-14: 0.25 mg via INTRAVENOUS
  Filled 2024-11-14: qty 1

## 2024-11-14 MED ORDER — ONDANSETRON 4 MG PO TBDP: 4.0000 mg | ORAL_TABLET | Freq: Three times a day (TID) | ORAL | 0 refills | Status: AC | PRN

## 2024-11-14 MED ORDER — MORPHINE SULFATE (PF) 4 MG/ML IV SOLN
4.0000 mg | Freq: Once | INTRAVENOUS | Status: AC
  Administered 2024-11-14: 4 mg via INTRAVENOUS
  Filled 2024-11-14: qty 1

## 2024-11-14 MED ORDER — IOHEXOL 300 MG/ML  SOLN
100.0000 mL | Freq: Once | INTRAMUSCULAR | Status: AC | PRN
  Administered 2024-11-14: 100 mL via INTRAVENOUS

## 2024-11-14 MED ORDER — SODIUM CHLORIDE 0.9 % IV BOLUS
1000.0000 mL | Freq: Once | INTRAVENOUS | Status: AC
  Administered 2024-11-14: 1000 mL via INTRAVENOUS

## 2024-11-14 MED ORDER — ONDANSETRON HCL 4 MG/2ML IJ SOLN
4.0000 mg | Freq: Once | INTRAMUSCULAR | Status: AC
  Administered 2024-11-14: 4 mg via INTRAVENOUS
  Filled 2024-11-14: qty 2

## 2024-11-14 NOTE — ED Provider Notes (Signed)
 "  [facility] Provider Note    Event Date/Time   First MD Initiated Contact with Patient 11/13/24 2359     (approximate)   History   Emesis  History was obtained using the Spanish interpreter HPI  Sabrina Pace is a 28 y.o. female with a past medical history of STDs and latent TB who presents to the emergency department with 2 days of generalized abdominal pain and intractable nausea and vomiting.  Patient reports the pain is tolerable and is constant and crampy.  States that her last menses was approximately 1 week ago and was normal.  Denies any vaginal pain or concern for STDs.  Has no prior history of abdominal surgeries except for a tummy tuck performed last year.  No chest pain or shortness of breath      Physical Exam   Triage Vital Signs: ED Triage Vitals  Encounter Vitals Group     BP 11/13/24 2314 120/70     Girls Systolic BP Percentile --      Girls Diastolic BP Percentile --      Boys Systolic BP Percentile --      Boys Diastolic BP Percentile --      Pulse Rate 11/13/24 2314 (!) 103     Resp 11/13/24 2314 20     Temp 11/13/24 2314 98.3 F (36.8 C)     Temp Source 11/13/24 2314 Oral     SpO2 11/13/24 2314 97 %     Weight 11/13/24 2314 142 lb (64.4 kg)     Height 11/13/24 2314 5' 1 (1.549 m)     Head Circumference --      Peak Flow --      Pain Score 11/13/24 2321 10     Pain Loc --      Pain Education --      Exclude from Growth Chart --     Most recent vital signs: Vitals:   11/13/24 2314  BP: 120/70  Pulse: (!) 103  Resp: 20  Temp: 98.3 F (36.8 C)  SpO2: 97%    Nursing Triage Note reviewed. Vital signs reviewed and patients oxygen saturation is normoxic  General: Patient is well nourished, well developed, awake and alert, comes in crying screaming and dry heaving into a bag Head: Normocephalic and atraumatic Eyes: Normal inspection, extraocular muscles intact, no conjunctival pallor Ear, nose,  throat: Normal external exam Neck: Normal range of motion Respiratory: Patient is in no respiratory distress, lungs CTAB Cardiovascular: Patient is tachycardic, RR without murmur appreciated GI: Abd soft, only tender to palpation in the epigastrium, no rebound or guarding, no CVA tenderness to palpation Back: Normal inspection of the back with good strength and range of motion throughout all ext Extremities: pulses intact with good cap refills, no LE pitting edema or calf tenderness Neuro: The patient is alert and oriented to person, place, and time, appropriately conversive, with 5/5 bilat UE/LE strength, no gross motor or sensory defects noted. Coordination appears to be adequate. Skin: Warm, dry, and intact   ED Results / Procedures / Treatments   Labs (all labs ordered are listed, but only abnormal results are displayed) Labs Reviewed  RESP PANEL BY RT-PCR (RSV, FLU A&B, COVID)  RVPGX2 - Abnormal; Notable for the following components:      Result Value   Influenza B by PCR POSITIVE (*)    All other components within normal limits  CBC WITH DIFFERENTIAL/PLATELET - Abnormal; Notable for the following components:  Hemoglobin 9.1 (*)    HCT 29.5 (*)    MCV 71.1 (*)    MCH 21.9 (*)    RDW 17.0 (*)    Platelets 435 (*)    Neutro Abs 7.8 (*)    All other components within normal limits  COMPREHENSIVE METABOLIC PANEL WITH GFR - Abnormal; Notable for the following components:   Potassium 3.0 (*)    Glucose, Bld 136 (*)    Calcium  8.6 (*)    All other components within normal limits  URINALYSIS, ROUTINE W REFLEX MICROSCOPIC - Abnormal; Notable for the following components:   Color, Urine YELLOW (*)    APPearance HAZY (*)    Specific Gravity, Urine 1.042 (*)    Hgb urine dipstick SMALL (*)    Ketones, ur 20 (*)    Protein, ur 100 (*)    All other components within normal limits  LIPASE, BLOOD  URINE DRUG SCREEN  POC URINE PREG, ED     EKG EKG and rhythm strip are  interpreted by myself:   EKG: [Normal sinus rhythm] at heart rate of 67, normal QRS duration, QTc 433, nonspecific ST segments and T waves no ectopy EKG not consistent with Acute STEMI Rhythm strip: NSR in lead II   RADIOLOGY CT abd and pelvis: No acute abnormality on my independent review and interpretation radiologist agrees    PROCEDURES:  Critical Care performed: No  Procedures   MEDICATIONS ORDERED IN ED: Medications  ondansetron  (ZOFRAN -ODT) disintegrating tablet 4 mg (4 mg Oral Given 11/13/24 2320)  sodium chloride  0.9 % bolus 1,000 mL (0 mLs Intravenous Stopped 11/14/24 0236)  ondansetron  (ZOFRAN ) injection 4 mg (4 mg Intravenous Given 11/14/24 0052)  morphine  (PF) 4 MG/ML injection 4 mg (4 mg Intravenous Given 11/14/24 0053)  famotidine  (PEPCID ) IVPB 20 mg premix (0 mg Intravenous Stopped 11/14/24 0114)  potassium chloride  SA (KLOR-CON  M) CR tablet 40 mEq (40 mEq Oral Given 11/14/24 0055)  iohexol  (OMNIPAQUE ) 300 MG/ML solution 100 mL (100 mLs Intravenous Contrast Given 11/14/24 0150)  LORazepam  (ATIVAN ) injection 0.25 mg (0.25 mg Intravenous Given 11/14/24 0141)  droperidol  (INAPSINE ) 2.5 MG/ML injection 1.25 mg (1.25 mg Intravenous Given 11/14/24 0142)     IMPRESSION / MDM / ASSESSMENT AND PLAN / ED COURSE                                Differential diagnosis includes, but is not limited to, URI, cholecystitis, appendicitis, gastritis, gastroenteritis, UTI, pregnancy  ED course: Patient presents acutely but her abdominal exam demonstrates no evidence of peritonitis.  To help control her symptoms an IV was inserted and she was given IV fluids, Zofran  and morphine  without resolve.  EKG demonstrated no prolonged QT interval and consequently she was given a dose of droperidol  and Ativan  with improvement in her symptoms.  Her COVID swabs did return positive for influenza.  Urinalysis was not consistent with UTI.  She had no leukocytosis.  Her hematocrit was mildly anemic  however patient subsequently reported a history of anemia and this is not unusual for her.  She did have a low potassium which was repleted but she had no elevation of her liver function tests.  CT abdomen pelvis demonstrated no acute abnormality.  I largely suspect patient symptoms are secondary to influenza.  She was advised to alternate between Tylenol  and ibuprofen  I did send a prescription of Zofran  to her pharmacy of choice.  She declined Tamiflu.  All questions answered and patient voiced understanding and requested discharge   Clinical Course as of 11/14/24 0407  Wed Nov 14, 2024  0009 Preg Test, Ur: NEGATIVE Not pregnant [HD]  0028 Influenza B By PCR(!): POSITIVE Influenza [HD]  0033 WBC: 9.3 No leukocytosis [HD]  0034 Potassium(!): 3.0 Will replete [HD]  0106 AST: 15 No elevated liver function tests [HD]  0222 Using the Spanish interpreter I explained the patient's results and she voiced understanding she was already taking a copious amount of p.o. when I went to interview her.  Repeat abdominal exam is completely benign.  She feels comfortable returning home.  I did offer Tamiflu and reviewed the indications benefits risks alternatives of above and agreement not to initiate this but I will send her with scripts for Zofran  [HD]    Clinical Course User Index [HD] Nicholaus Rolland BRAVO, MD   -- Risk: 5 This patient has a high risk of morbidity due to further diagnostic testing or treatment. Rationale: This patients evaluation and management involve a high risk of morbidity due to the potential severity of presenting symptoms, need for diagnostic testing, and/or initiation of treatment that may require close monitoring. The differential includes conditions with potential for significant deterioration or requiring escalation of care. Treatment decisions in the ED, including medication administration, procedural interventions, or disposition planning, reflect this level of risk. COPA: 5 The  patient has the following acute or chronic illness/injury that poses a possible threat to life or bodily function: [X] : The patient has a potentially serious acute condition or an acute exacerbation of a chronic illness requiring urgent evaluation and management in the Emergency Department. The clinical presentation necessitates immediate consideration of life-threatening or function-threatening diagnoses, even if they are ultimately ruled out.   FINAL CLINICAL IMPRESSION(S) / ED DIAGNOSES   Final diagnoses:  Nausea and vomiting, unspecified vomiting type  Epigastric abdominal pain  Influenza A     Rx / DC Orders   ED Discharge Orders          Ordered    ondansetron  (ZOFRAN -ODT) 4 MG disintegrating tablet  Every 8 hours PRN        11/14/24 0223             Note:  This document was prepared using Dragon voice recognition software and may include unintentional dictation errors.   Nicholaus Rolland BRAVO, MD 11/14/24 [phone]  "

## 2024-11-14 NOTE — Discharge Instructions (Addendum)
 PRECAUCIONES PARA VOLVER Y SEGUIMIENTO: (ESPAOL) PRECAUCIONES DE DEVOLUCIN: Regrese inmediatamente al departamento de emergencias o llame a su mdico si se siente peor, dbil, le falta el aliento, o tiene [place], vmitos, dolor, sangrado o heces oscuras, dificultad para Geographical information systems officer o cualquier problema nuevo. ATENCIN DE SEGUIMIENTO: Llame a su mdico y/o a los mdicos que lo referieron para obtener ms informacin y para Engineer, water cita. Haz esto hoy, maana o despus del fin de semana. Varios medicos solo toman un seguro PPO, si usted tiene un seguro HMO debe comunicarse con su HMO o su mdico primario para que lo envie a Music therapist de acuerdo a Psychologist, prison and probation services, de no ser asi tendria que pagar en efectivo por la consulta. Asegrese de decirles que fue una referencia del departamento de emergencias para que pueda obtener la cita ms pronto posible. Si su presin arterial fue superior a 120/80, vuelva a verificar su presin arterial dentro de 1 a 2 semanas. SUS RESULTADOS DE LOS ESTUDIOS: Lleve copias o informes de cualquier examen realizado hoy, incluidos anlisis de sangre u comoros, imgenes y Animal nutritionist, a su mdico y a Comptroller mdico de Mining engineer. Debe repetir las estudios anormales. Su mdico o un mdico de referencia pueden informarle cundo se debe repetir [employer]. Adems, asegrese de que su mdico se comunique con este hospital para obtener otros resultados de [place], como laboratorios que an no se han realizado, e informes finales de imgenes, que pueden contener resultados importantes adicionales no documentados en el informe de Manufacturing engineer. CMO LLEGAR A CASA DE MANERA SEGURA: No conduzca, camine o tome el autobs a su hogar si ha recibido Facilities manager sedante, como medicamentos para la ansiedad o ciertos analgsicos o antihistamnicos como Benadryl. Si este es [place], guinea un taxi a casa o haga que un amigo lo lleve a su casa.

## 2024-11-16 ENCOUNTER — Encounter: Payer: Self-pay | Admitting: Radiology

## 2024-11-16 ENCOUNTER — Other Ambulatory Visit: Payer: Self-pay

## 2024-11-16 ENCOUNTER — Emergency Department: Admission: EM | Admit: 2024-11-16 | Discharge: 2024-11-17 | Discharge status: Against Medical Advice | Payer: Self-pay

## 2024-11-16 DIAGNOSIS — Z5329 Procedure and treatment not carried out because of patient's decision for other reasons: Secondary | ICD-10-CM | POA: Insufficient documentation

## 2024-11-16 DIAGNOSIS — E876 Hypokalemia: Secondary | ICD-10-CM | POA: Insufficient documentation

## 2024-11-16 DIAGNOSIS — R112 Nausea with vomiting, unspecified: Secondary | ICD-10-CM | POA: Insufficient documentation

## 2024-11-16 LAB — URINALYSIS, ROUTINE W REFLEX MICROSCOPIC
Bacteria, UA: NONE SEEN
Bilirubin Urine: NEGATIVE
Glucose, UA: NEGATIVE mg/dL
Ketones, ur: 5 mg/dL — AB
Nitrite: NEGATIVE
Protein, ur: 30 mg/dL — AB
Specific Gravity, Urine: 1.019 (ref 1.005–1.030)
pH: 5 (ref 5.0–8.0)

## 2024-11-16 LAB — CBC
HCT: 31.7 % — ABNORMAL LOW (ref 36.0–46.0)
Hemoglobin: 9.6 g/dL — ABNORMAL LOW (ref 12.0–15.0)
MCH: 21.6 pg — ABNORMAL LOW (ref 26.0–34.0)
MCHC: 30.3 g/dL (ref 30.0–36.0)
MCV: 71.2 fL — ABNORMAL LOW (ref 80.0–100.0)
Platelets: 413 K/uL — ABNORMAL HIGH (ref 150–400)
RBC: 4.45 MIL/uL (ref 3.87–5.11)
RDW: 17.2 % — ABNORMAL HIGH (ref 11.5–15.5)
WBC: 7.9 K/uL (ref 4.0–10.5)
nRBC: 0 % (ref 0.0–0.2)

## 2024-11-16 LAB — COMPREHENSIVE METABOLIC PANEL WITH GFR
ALT: 11 U/L (ref 0–44)
AST: 14 U/L — ABNORMAL LOW (ref 15–41)
Albumin: 4.5 g/dL (ref 3.5–5.0)
Alkaline Phosphatase: 74 U/L (ref 38–126)
Anion gap: 19 — ABNORMAL HIGH (ref 5–15)
BUN: 7 mg/dL (ref 6–20)
CO2: 23 mmol/L (ref 22–32)
Calcium: 8.8 mg/dL — ABNORMAL LOW (ref 8.9–10.3)
Chloride: 99 mmol/L (ref 98–111)
Creatinine, Ser: 0.63 mg/dL (ref 0.44–1.00)
GFR, Estimated: >60 mL/min
Glucose, Bld: 109 mg/dL — ABNORMAL HIGH (ref 70–99)
Potassium: 2.8 mmol/L — ABNORMAL LOW (ref 3.5–5.1)
Sodium: 140 mmol/L (ref 135–145)
Total Bilirubin: <0.2 mg/dL (ref 0.0–1.2)
Total Protein: 8.1 g/dL (ref 6.5–8.1)

## 2024-11-16 LAB — POC URINE PREG, ED: Preg Test, Ur: NEGATIVE

## 2024-11-16 LAB — LIPASE, BLOOD: Lipase: 19 U/L (ref 11–51)

## 2024-11-16 MED ORDER — ONDANSETRON 4 MG PO TBDP
4.0000 mg | ORAL_TABLET | Freq: Once | ORAL | Status: AC | PRN
  Administered 2024-11-16: 4 mg via ORAL
  Filled 2024-11-16: qty 1

## 2024-11-16 NOTE — ED Triage Notes (Signed)
 Pt has had N/V for the past 4 days. Pt was diagnosed with the flu on the 23rd. She has not been able to stop vomiting at home. She was prescribed nausea medication for home but she was unable to afford it. Pt states that she is now vomiting blood.

## 2024-11-17 MED ORDER — PANTOPRAZOLE SODIUM 40 MG IV SOLR
40.0000 mg | Freq: Once | INTRAVENOUS | Status: AC
  Administered 2024-11-17: 40 mg via INTRAVENOUS
  Filled 2024-11-17: qty 10

## 2024-11-17 MED ORDER — ALUM & MAG HYDROXIDE-SIMETH 200-200-20 MG/5ML PO SUSP
30.0000 mL | Freq: Once | ORAL | Status: AC
  Administered 2024-11-17: 30 mL via ORAL
  Filled 2024-11-17: qty 30

## 2024-11-17 MED ORDER — POTASSIUM CHLORIDE CRYS ER 20 MEQ PO TBCR
40.0000 meq | EXTENDED_RELEASE_TABLET | Freq: Once | ORAL | Status: AC
  Administered 2024-11-17: 40 meq via ORAL
  Filled 2024-11-17: qty 2

## 2024-11-17 MED ORDER — PROCHLORPERAZINE EDISYLATE 10 MG/2ML IJ SOLN
10.0000 mg | Freq: Once | INTRAMUSCULAR | Status: AC
  Administered 2024-11-17: 10 mg via INTRAVENOUS
  Filled 2024-11-17: qty 2

## 2024-11-17 MED ORDER — LIDOCAINE VISCOUS HCL 2 % MT SOLN
15.0000 mL | Freq: Once | OROMUCOSAL | Status: AC
  Administered 2024-11-17: 15 mL via ORAL
  Filled 2024-11-17: qty 15

## 2024-11-17 MED ORDER — SODIUM CHLORIDE 0.9 % IV BOLUS
1000.0000 mL | Freq: Once | INTRAVENOUS | Status: AC
  Administered 2024-11-17: 1000 mL via INTRAVENOUS

## 2024-11-17 MED ORDER — POTASSIUM CHLORIDE 10 MEQ/100ML IV SOLN
10.0000 meq | Freq: Once | INTRAVENOUS | Status: AC
  Administered 2024-11-17: 10 meq via INTRAVENOUS
  Filled 2024-11-17: qty 100

## 2024-11-17 NOTE — ED Notes (Addendum)
 Attempted to call the patient, the sister, and the mother listed in the patient's demographics. No answer from any of the numbers. ([phone])

## 2024-11-17 NOTE — ED Provider Notes (Signed)
 "  [facility] Provider Note    Event Date/Time   First MD Initiated Contact with Patient 11/17/24 0021     (approximate)   History   Abdominal Pain and Emesis  Pt has had N/V for the past 4 days. Pt was diagnosed with the flu on the 23rd. She has not been able to stop vomiting at home. She was prescribed nausea medication for home but she was unable to afford it. Pt states that she is now vomiting blood.    HPI Sabrina Pace is a 28 y.o. female anemia, STIs presents for evaluation of nausea and vomiting - Patient has had about 4 days of nausea and vomiting.  See summary of recent ED visit below.  Re-presents today because she is not tolerating p.o. --notes intractable nausea and vomiting.  Was prescribed Zofran  but was unable to fill it because she said it cost too much.  Did have small streaks of blood in her vomit today.  Last bowel movement was this morning, no diarrhea.  No urinary symptoms.  No vaginal bleeding or discharge.  Notes a mild burning sensation in her epigastric region but denies any frank abdominal pain. - No chest pain - Does use occasional marijuana, states usually about once a week.  She is frequently around other people who smoke however.   Per chart review, seen in our emergency department on 11/13/2024 complaining of abdominal pain, nausea.  Diagnosed with influenza.  CT abdomen pelvis negative.     Physical Exam   Triage Vital Signs: ED Triage Vitals  Encounter Vitals Group     BP 11/16/24 2122 (!) 120/103     Girls Systolic BP Percentile --      Girls Diastolic BP Percentile --      Boys Systolic BP Percentile --      Boys Diastolic BP Percentile --      Pulse Rate 11/16/24 2122 (!) 114     Resp 11/16/24 2122 (!) 21     Temp 11/16/24 2122 98.3 F (36.8 C)     Temp Source 11/16/24 2122 Oral     SpO2 11/16/24 2122 94 %     Weight 11/16/24 2117 142 lb (64.4 kg)     Height 11/16/24 2117 5' 1 (1.549 m)     Head  Circumference --      Peak Flow --      Pain Score --      Pain Loc --      Pain Education --      Exclude from Growth Chart --     Most recent vital signs: Vitals:   11/16/24 2122 11/17/24 0100  BP: (!) 120/103 125/80  Pulse: (!) 114 97  Resp: (!) 21 18  Temp: 98.3 F (36.8 C) 98 F (36.7 C)  SpO2: 94% 96%     General: Awake, no distress.  CV:  Good peripheral perfusion.  Mild tachycardia, regular rhythm, RP 2+.  No chest wall crepitus. Resp:  Normal effort. CTAB Abd:  No distention. Nontender to deep palpation throughout    ED Results / Procedures / Treatments   Labs (all labs ordered are listed, but only abnormal results are displayed) Labs Reviewed  COMPREHENSIVE METABOLIC PANEL WITH GFR - Abnormal; Notable for the following components:      Result Value   Potassium 2.8 (*)    Glucose, Bld 109 (*)    Calcium  8.8 (*)    AST 14 (*)  Anion gap 19 (*)    All other components within normal limits  CBC - Abnormal; Notable for the following components:   Hemoglobin 9.6 (*)    HCT 31.7 (*)    MCV 71.2 (*)    MCH 21.6 (*)    RDW 17.2 (*)    Platelets 413 (*)    All other components within normal limits  URINALYSIS, ROUTINE W REFLEX MICROSCOPIC - Abnormal; Notable for the following components:   Color, Urine YELLOW (*)    APPearance HAZY (*)    Hgb urine dipstick MODERATE (*)    Ketones, ur 5 (*)    Protein, ur 30 (*)    Leukocytes,Ua TRACE (*)    All other components within normal limits  LIPASE, BLOOD  POC URINE PREG, ED     EKG  N/a   RADIOLOGY N/a    PROCEDURES:  Critical Care performed: No  Procedures   MEDICATIONS ORDERED IN ED: Medications  ondansetron  (ZOFRAN -ODT) disintegrating tablet 4 mg (4 mg Oral Given 11/16/24 2132)  sodium chloride  0.9 % bolus 1,000 mL (0 mLs Intravenous Stopped 11/17/24 0349)  potassium chloride  SA (KLOR-CON  M) CR tablet 40 mEq (40 mEq Oral Given 11/17/24 0131)  potassium chloride  10 mEq in 100 mL IVPB (0  mEq Intravenous Stopped 11/17/24 0252)  prochlorperazine  (COMPAZINE ) injection 10 mg (10 mg Intravenous Given 11/17/24 0131)  alum & mag hydroxide-simeth (MAALOX/MYLANTA) 200-200-20 MG/5ML suspension 30 mL (30 mLs Oral Given 11/17/24 0131)    And  lidocaine  (XYLOCAINE ) 2 % viscous mouth solution 15 mL (15 mLs Oral Given 11/17/24 0131)  pantoprazole  (PROTONIX ) injection 40 mg (40 mg Intravenous Given 11/17/24 0130)     IMPRESSION / MDM / ASSESSMENT AND PLAN / ED COURSE  I reviewed the triage vital signs and the nursing notes.                              DDX/MDM/AP: Differential diagnosis includes, but is not limited to, nausea and vomiting secondary to known influenza.  Consider underlying UTI.  Very benign abdominal exam here, is still having bowel movements-I do not clinically suspect acute intra-abdominal pathology including bowel obstruction, pancreatitis, cholecystitis, appendicitis, diverticulitis.  Do suspect some degree of dehydration and likely electrolyte derangements.  Consider cannabis hyperemesis.  Plan: - Labs  -IV fluid EKG - No indication for repeat imaging at this time - Reassess  Patient's presentation is most consistent with acute presentation with potential threat to life or bodily function.  The patient is on the cardiac monitor to evaluate for evidence of arrhythmia and/or significant heart rate changes.  ED course below.  Laboratory workup notable for hypokalemia, repleted p.o. and IV.  Patient reportedly feeling much better after IV fluids, GI cocktail, Compazine  though unfortunately eloped from emergency department prior to reevaluation.  We attempted to call patient and family members multiple times but got no response.  Did have IV in place, PD called for wellness check.  Had previously been prescribed Zofran -can continue with this.  Clinical Course as of 11/17/24 0719  Sat Nov 17, 2024  0108 CBC with no leukocytosis, stable/improved anemia  CMP with  hypokalemia to 2.8, mildly elevated anion gap --suspect secondary to dehydration.  LFTs and lipase normal.  UA not grossly consistent with infection  hCG negative [MM]  0307 Patient feeling much better, has tolerated p.o. without difficulty, repeat abdominal exam benign.  Presentation overall consistent with influenza.  Also counseled on  marijuana cessation as this may be contributing.  Will reevaluate and plan for discharge home with antiemetic and plan for PMD follow-up.  ED return precautions in place.  Patient agrees with plan. [MM]  [phone] I attempted to reevaluate the patient twice within the past 45 minutes but she appears not to be in her room nor in any bathrooms, may have eloped.  Security checking security cameras.  Did have an IV in place. [MM]    Clinical Course User Index [MM] Clarine Ozell LABOR, MD     FINAL CLINICAL IMPRESSION(S) / ED DIAGNOSES   Final diagnoses:  Nausea and vomiting, unspecified vomiting type     Rx / DC Orders   ED Discharge Orders     None        Note:  This document was prepared using Dragon voice recognition software and may include unintentional dictation errors.   Clarine Ozell LABOR, MD 11/17/24 [phone]  "

## 2024-11-17 NOTE — ED Notes (Addendum)
 This RN informed by another nurse that MD was looking for pt. Upon entering rm pt missing. This RN searched lobby and throughout unit with no success. It is unknown if pt still has in IV. No signs of IV in rm. Attempted to call pt with no answer.

## 2024-11-17 NOTE — ED Notes (Addendum)
 Called [employer] to send an officer for a wellness check to verify whether or not the patient still has her IV in. ([phone])

## 2024-11-17 NOTE — ED Notes (Signed)
 Pt up to bathroom and back into bed. Reports she is feeling better and would like to go home

## 2024-11-17 NOTE — ED Notes (Signed)
 According to [employer], patient could not be reached at the address in the chart. This NT also gave them of her mother, Hadassah. No additional updates at this time. [phone]

## 2024-11-17 NOTE — ED Notes (Signed)
 Per security after checking cameras, pt has left campus.
